# Patient Record
Sex: Female | Born: 1985 | Race: Black or African American | Hispanic: No | Marital: Single | Smoking: Never smoker
Health system: Southern US, Community
[De-identification: ages and names within clinical notes are randomized; demographics above are authoritative.]

## PROBLEM LIST (undated history)

## (undated) DIAGNOSIS — D649 Anemia, unspecified: Secondary | ICD-10-CM

## (undated) DIAGNOSIS — T7840XA Allergy, unspecified, initial encounter: Secondary | ICD-10-CM

## (undated) HISTORY — DX: Anemia, unspecified: D64.9

## (undated) HISTORY — DX: Allergy, unspecified, initial encounter: T78.40XA

---
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
## 2003-08-06 ENCOUNTER — Other Ambulatory Visit: Admission: RE | Admit: 2003-08-06 | Discharge: 2003-08-06 | Payer: Self-pay | Admitting: Internal Medicine

## 2004-09-11 ENCOUNTER — Other Ambulatory Visit: Admission: RE | Admit: 2004-09-11 | Discharge: 2004-09-11 | Payer: Self-pay | Admitting: Internal Medicine

## 2004-09-30 ENCOUNTER — Encounter: Admission: RE | Admit: 2004-09-30 | Discharge: 2004-09-30 | Payer: Self-pay | Admitting: Internal Medicine

## 2005-08-16 ENCOUNTER — Ambulatory Visit (HOSPITAL_COMMUNITY): Admission: RE | Admit: 2005-08-16 | Discharge: 2005-08-16 | Payer: Self-pay | Admitting: Obstetrics

## 2005-10-18 ENCOUNTER — Ambulatory Visit (HOSPITAL_COMMUNITY): Admission: RE | Admit: 2005-10-18 | Discharge: 2005-10-18 | Payer: Self-pay | Admitting: Obstetrics

## 2005-11-01 ENCOUNTER — Inpatient Hospital Stay (HOSPITAL_COMMUNITY): Admission: AD | Admit: 2005-11-01 | Discharge: 2005-11-01 | Payer: Self-pay | Admitting: Obstetrics & Gynecology

## 2005-11-02 ENCOUNTER — Inpatient Hospital Stay (HOSPITAL_COMMUNITY): Admission: AD | Admit: 2005-11-02 | Discharge: 2005-11-02 | Payer: Self-pay | Admitting: Obstetrics

## 2005-12-02 ENCOUNTER — Ambulatory Visit (HOSPITAL_COMMUNITY): Admission: RE | Admit: 2005-12-02 | Discharge: 2005-12-02 | Payer: Self-pay | Admitting: Obstetrics

## 2006-02-15 ENCOUNTER — Inpatient Hospital Stay (HOSPITAL_COMMUNITY): Admission: AD | Admit: 2006-02-15 | Discharge: 2006-02-15 | Payer: Self-pay | Admitting: Obstetrics

## 2006-02-21 ENCOUNTER — Inpatient Hospital Stay (HOSPITAL_COMMUNITY): Admission: AD | Admit: 2006-02-21 | Discharge: 2006-02-23 | Payer: Self-pay | Admitting: Obstetrics

## 2007-06-17 IMAGING — US US FETAL BPP W/O NONSTRESS
1 series · 14 of 26 positions shown · non-contrast
Comparison: 12/02/05.

CLINICAL DATA: Decreased fetal movement.

 BIOPHYSICAL PROFILE

[Series 1: us fetal bpp w/o nonstress · 0.35mm/px · 26 acquisitions, 14 frames shown]
[im 1/26]
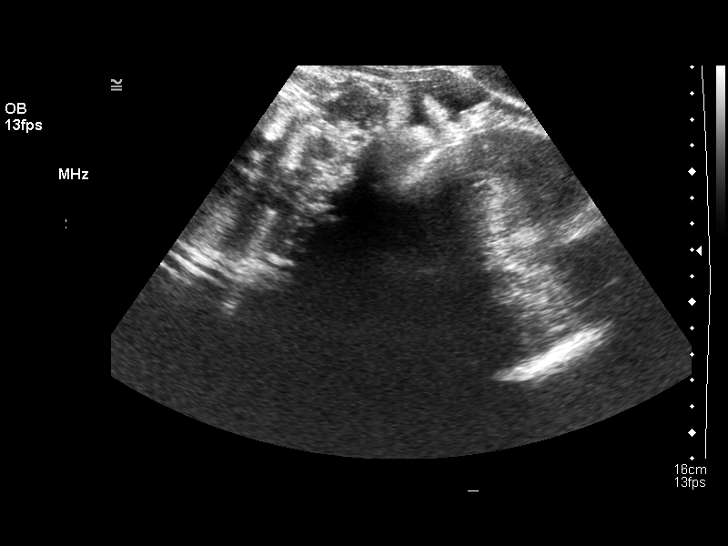
[im 3/26]
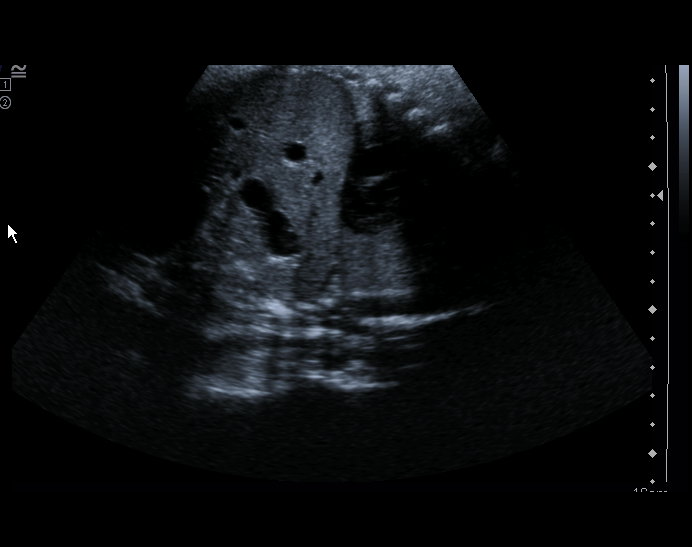
[im 5/26]
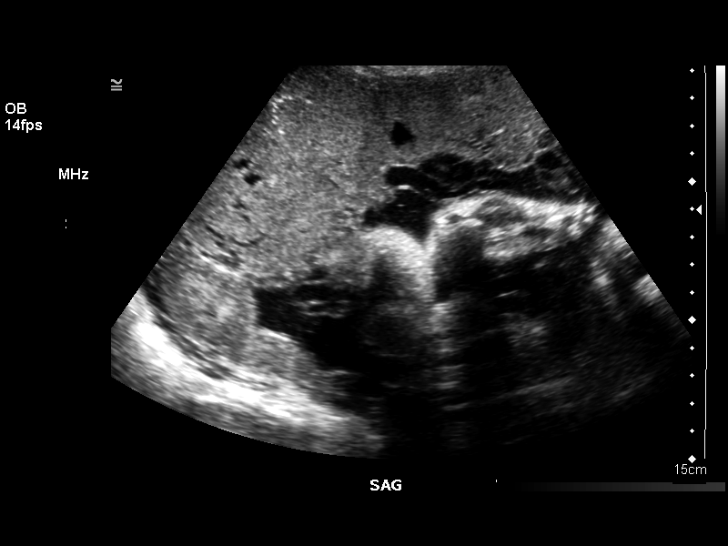
[im 7/26]
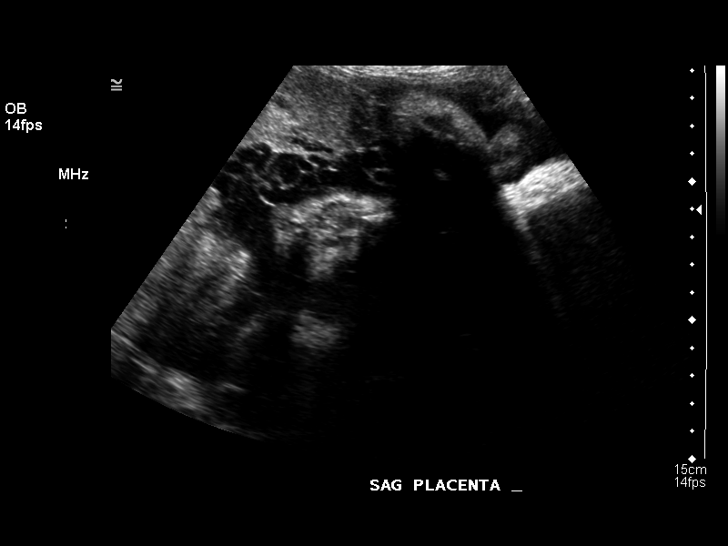
[im 9/26]
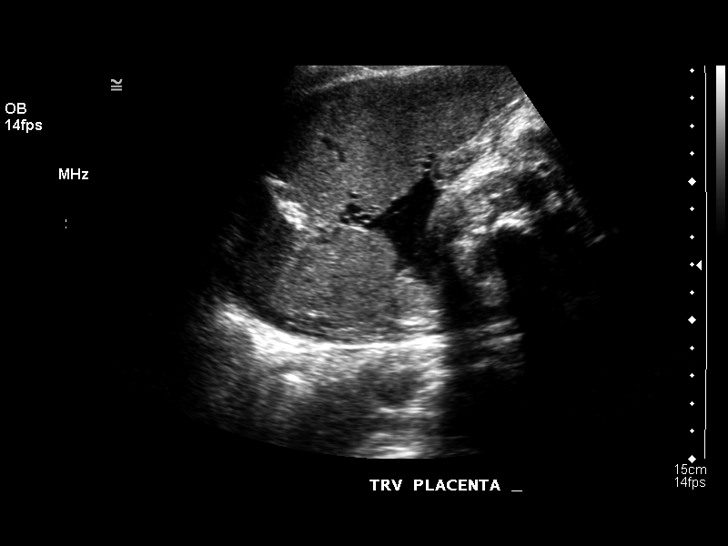
[im 11/26]
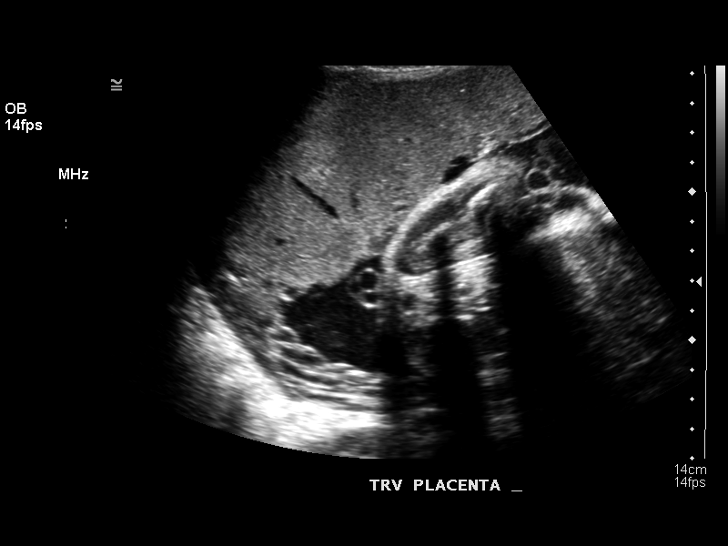
[im 13/26]
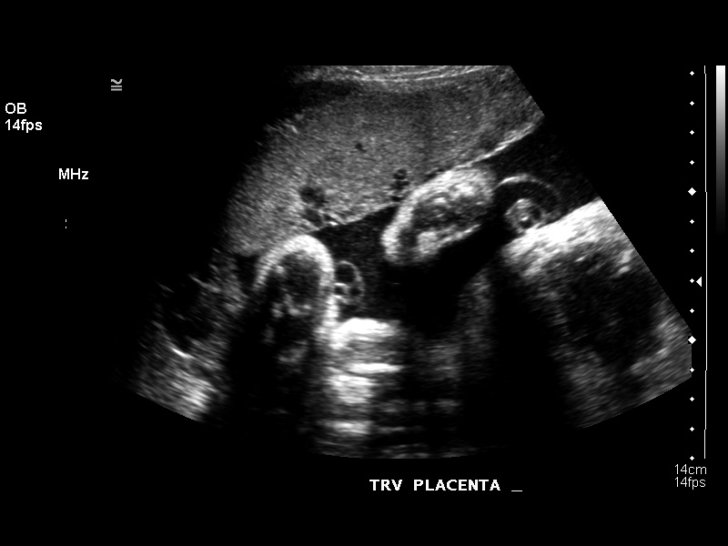
[im 14/26]
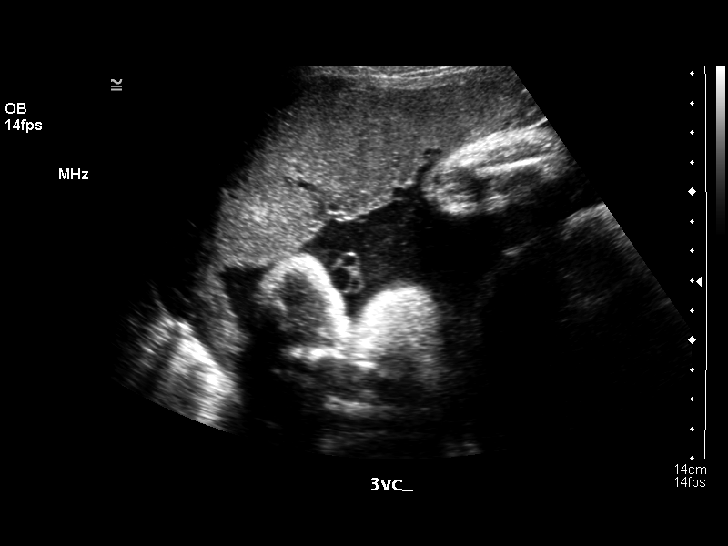
[im 16/26]
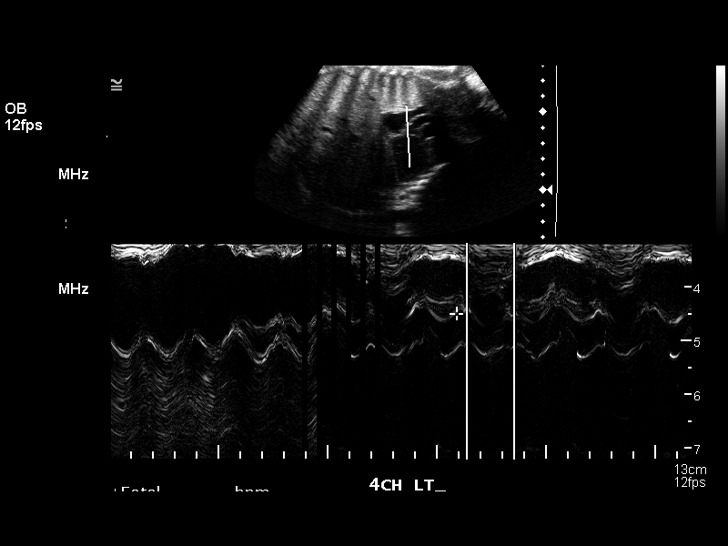
[im 18/26]
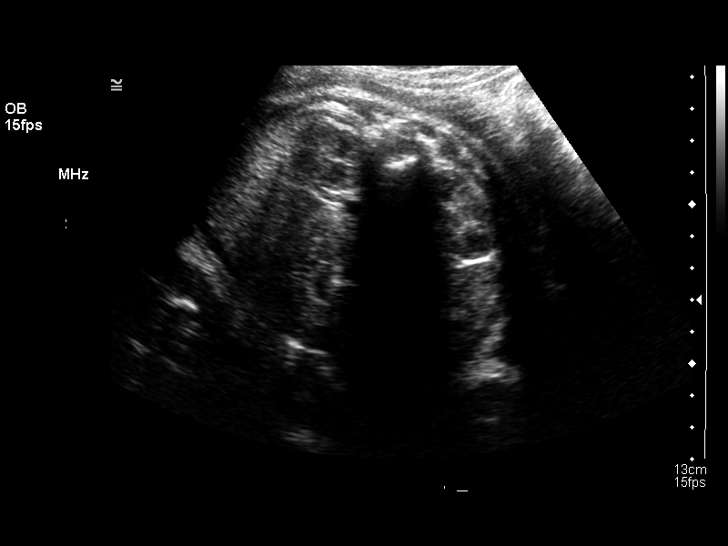
[im 20/26]
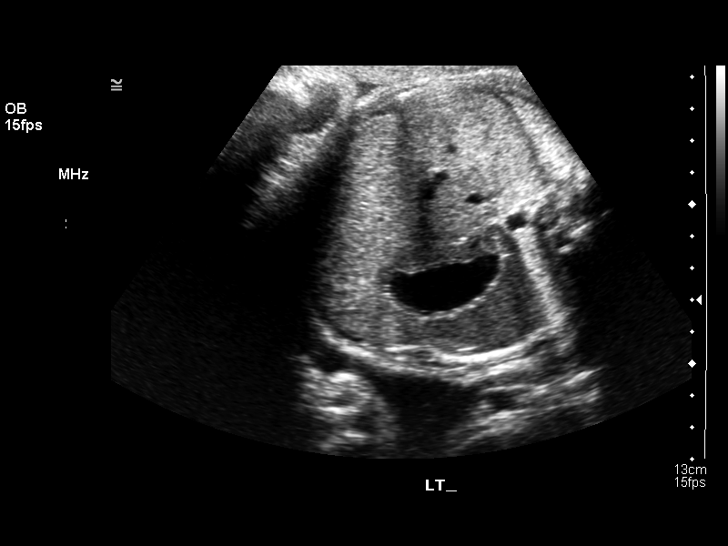
[im 22/26]
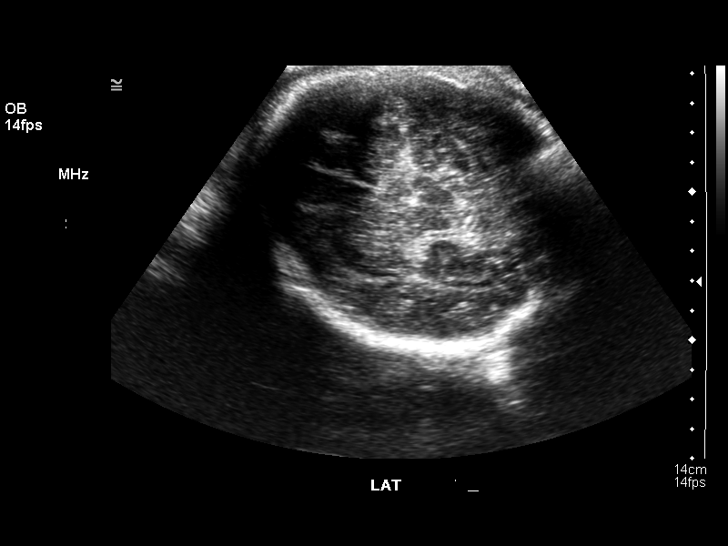
[im 24/26]
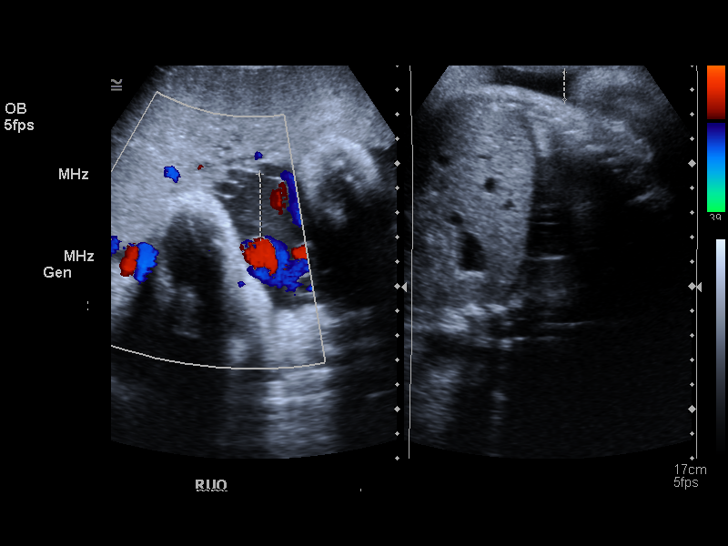
[im 26/26]
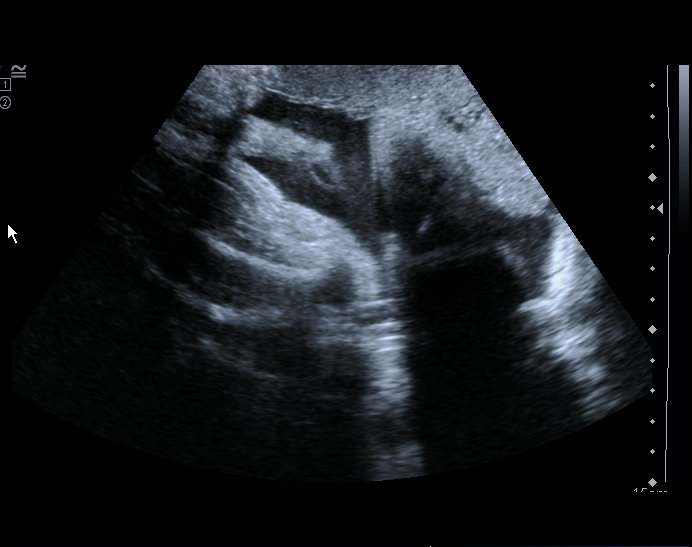

[14 of 26 positions shown; findings below may reference images not displayed]

Number of Fetuses: 1
 Heart rate: 139
 Movement:  Yes 
 Breathing:  Yes
 Presentation:  Cephalic
 Placental Location:  Right lateral 
 Grade:  I
 Previa:  No
 Amniotic Fluid (Subjective):  Normal
 Amniotic Fluid (Objective):  12.8 cm AFI (5th -95th%ile = 7.2 ? 22.6 cm for 39 wks)

 Fetal measurements and complete anatomic evaluation were not requested.  The following fetal anatomy was visualized on this exam:  Lateral ventricles, four chamber heart, stomach, 3-vessel cord, kidneys, and bladder.

 BPP SCORING
 Movements:  2  Time:  15 minutes
 Breathing:  2
 Tone:  2
 Amniotic Fluid:  2
 Total Score:  8

 MATERNAL UTERINE AND ADNEXAL FINDINGS
 Cervix:  Not evaluated > 34 wks
IMPRESSION: 1.  Single living intrauterine fetus in cephalic presentation with subjectively and quantitatively normal amniotic fluid volume.
 2.  Biophysical profile score [DATE] after 15 minutes of observation.

## 2007-12-21 ENCOUNTER — Encounter: Admission: RE | Admit: 2007-12-21 | Discharge: 2007-12-21 | Payer: Self-pay | Admitting: Surgery

## 2010-01-25 ENCOUNTER — Emergency Department (HOSPITAL_COMMUNITY): Admission: EM | Admit: 2010-01-25 | Discharge: 2010-01-25 | Payer: Self-pay | Admitting: Emergency Medicine

## 2010-07-28 ENCOUNTER — Emergency Department (HOSPITAL_COMMUNITY): Admission: EM | Admit: 2010-07-28 | Discharge: 2010-07-28 | Payer: Self-pay | Admitting: Emergency Medicine

## 2010-12-06 ENCOUNTER — Encounter: Payer: Self-pay | Admitting: Internal Medicine

## 2011-05-27 IMAGING — CT CT HEAD W/O CM
1 of 2 series · 13 of 30 positions shown, 17 images · non-contrast
Comparison: None.

CLINICAL DATA: 23-year-old with headache.

CT HEAD WITHOUT CONTRAST
TECHNIQUE: Contiguous axial images were obtained from the base of
the skull through the vertex without contrast.

[Series 2: brain · axial · 0.47mm/px · z∈[+130,+250]mm · 13 of 28 slices shown, 17 images]
[im 2/28  brain]
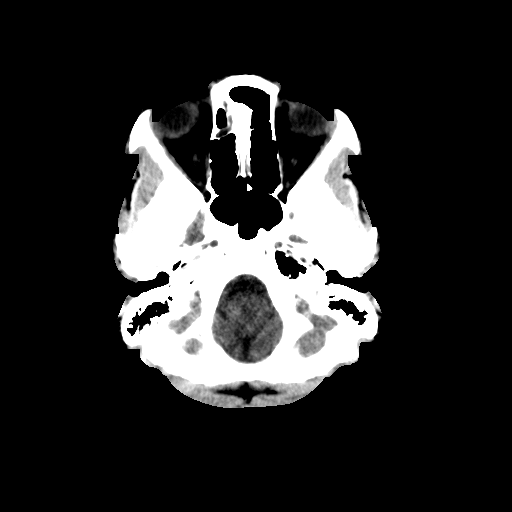
[im 2/28  bone]
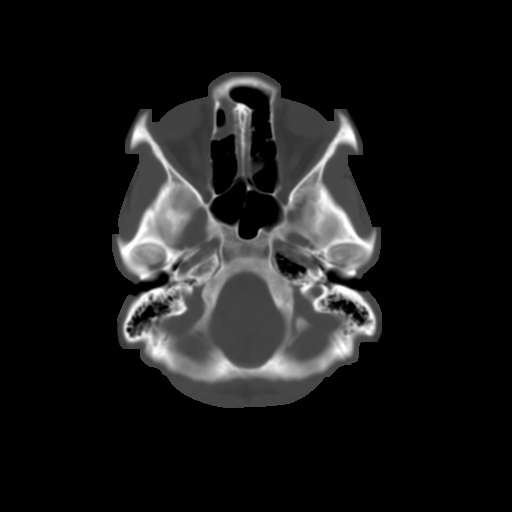
[im 4/28  brain]
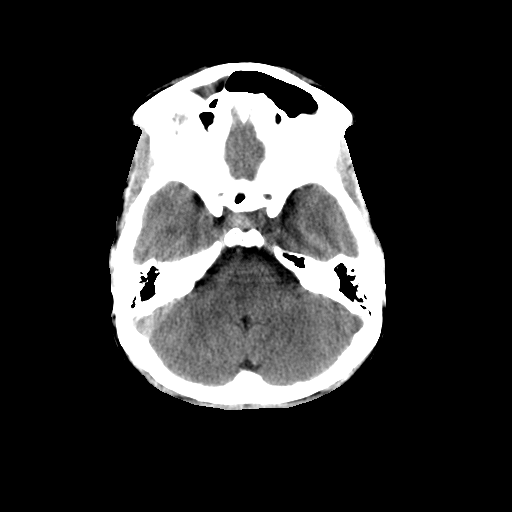
[im 6/28  brain]
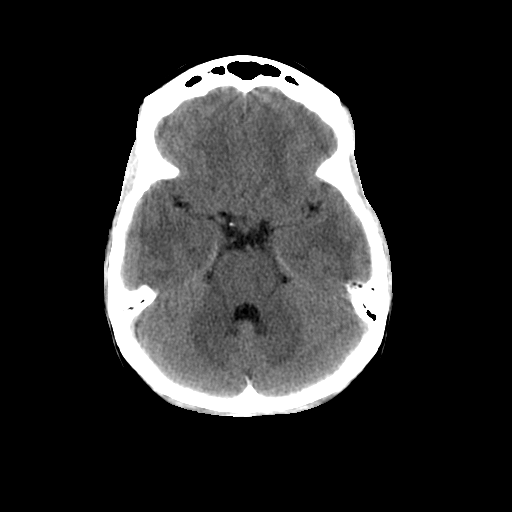
[im 8/28  brain]
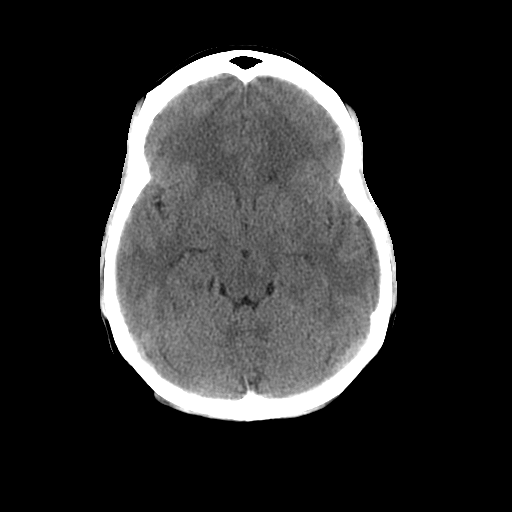
[im 10/28  brain]
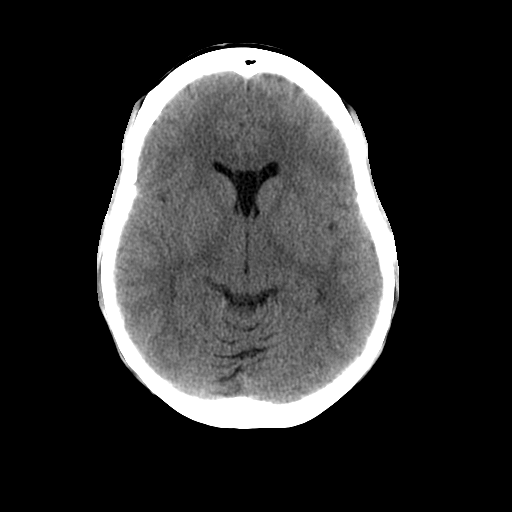
[im 10/28  bone]
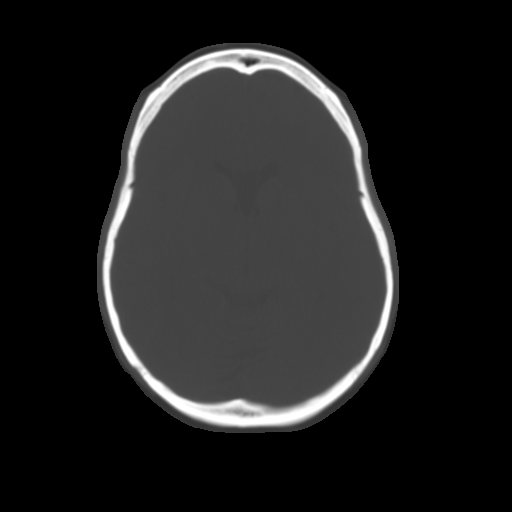
[im 12/28  brain]
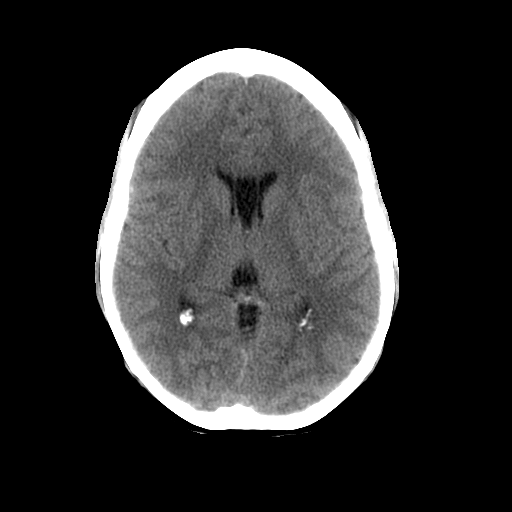
[im 14/28  brain]
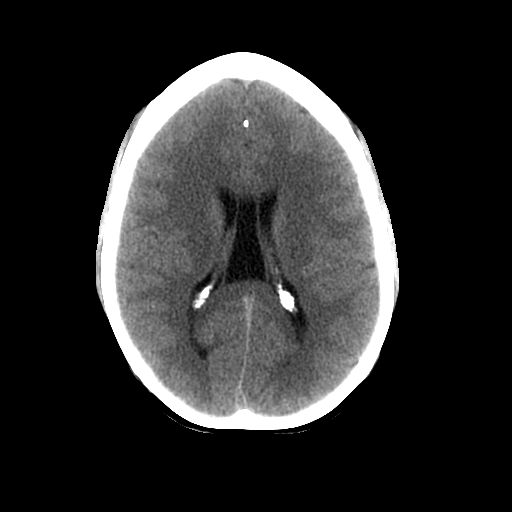
[im 16/28  brain]
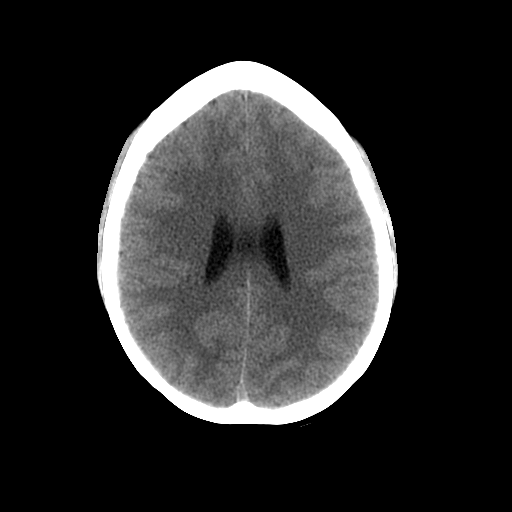
[im 18/28  brain]
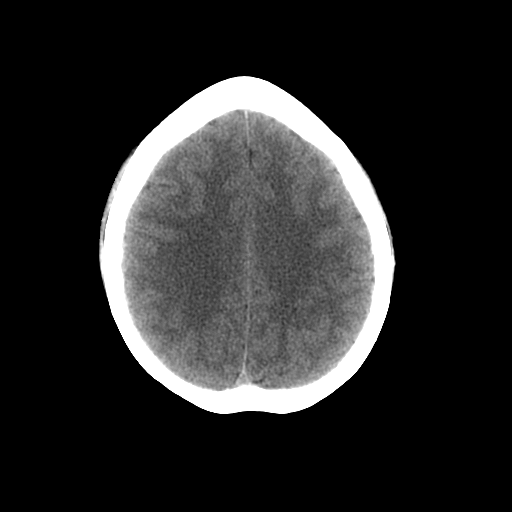
[im 18/28  bone]
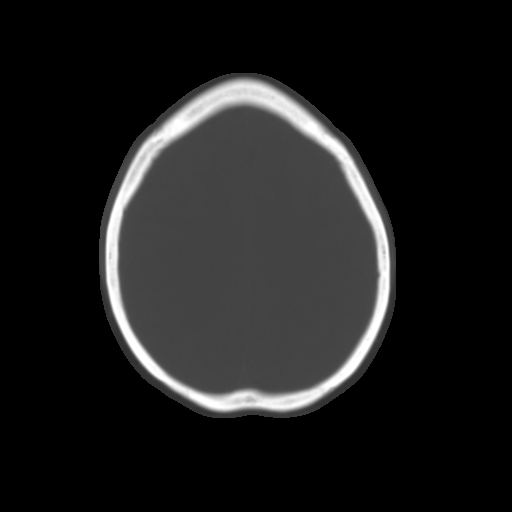
[im 20/28  brain]
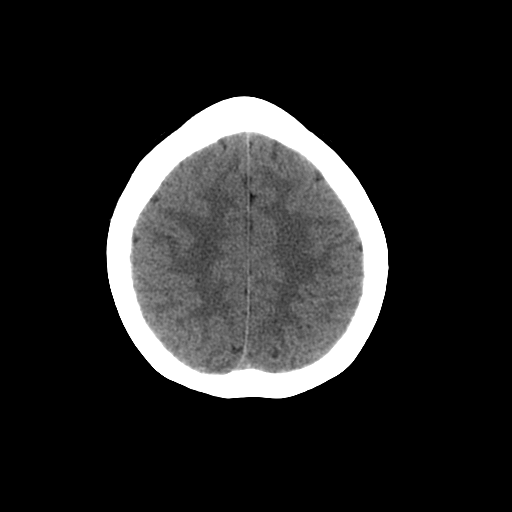
[im 22/28  brain]
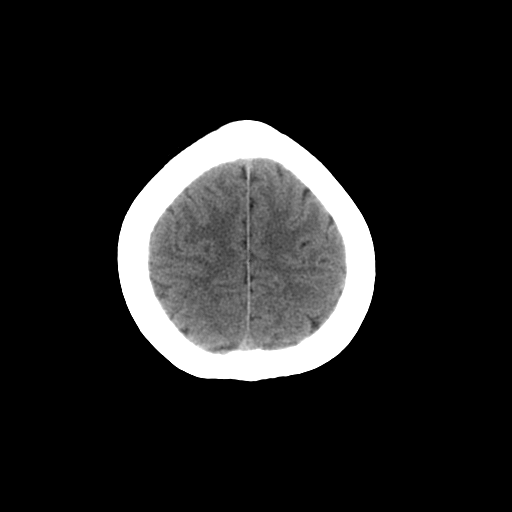
[im 24/28  brain]
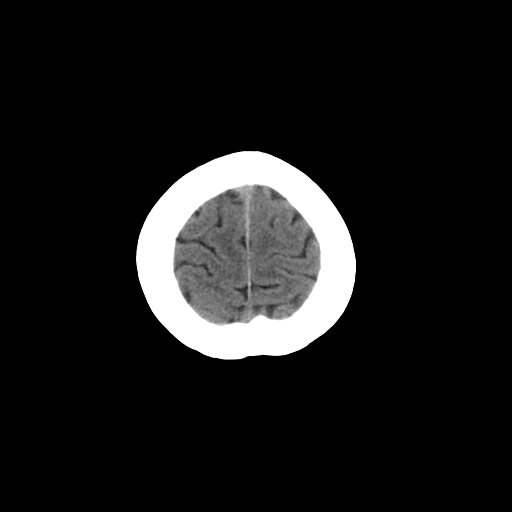
[im 26/28  brain]
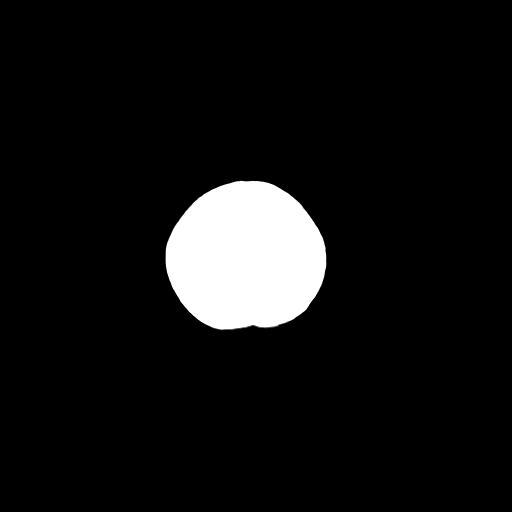
[im 26/28  bone]
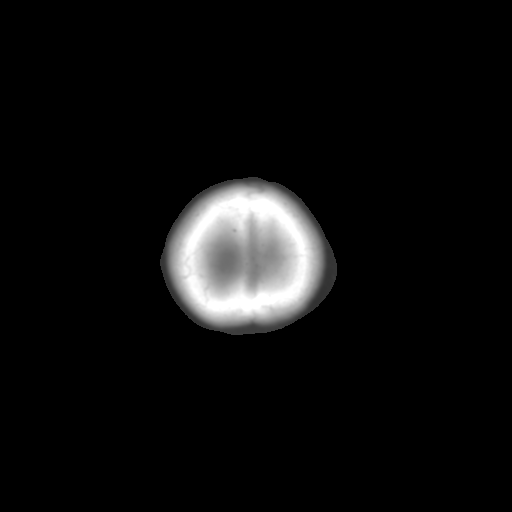

[13 of 30 positions shown; findings below may reference images not displayed]

FINDINGS: No evidence for acute hemorrhage, mass lesion, midline
shift,  hydrocephalus or large infarct.  Incidentally, the patient
has a cavum septum pellucidum and vergae. Patchy areas of mucosal
disease involving the ethmoid air cells with fluid and mucosal
disease in the right frontal sinus.  No acute bony abnormalities.
IMPRESSION: No acute intracranial abnormality.

Paranasal sinus disease.

## 2012-04-30 ENCOUNTER — Encounter (HOSPITAL_COMMUNITY): Payer: Self-pay | Admitting: Emergency Medicine

## 2012-04-30 ENCOUNTER — Emergency Department (HOSPITAL_COMMUNITY)
Admission: EM | Admit: 2012-04-30 | Discharge: 2012-04-30 | Disposition: A | Discharge status: Home / Self Care | Payer: Self-pay | Attending: Emergency Medicine | Admitting: Emergency Medicine

## 2012-04-30 DIAGNOSIS — N390 Urinary tract infection, site not specified: Secondary | ICD-10-CM | POA: Insufficient documentation

## 2012-04-30 LAB — URINALYSIS, ROUTINE W REFLEX MICROSCOPIC
Glucose, UA: NEGATIVE mg/dL
Protein, ur: 100 mg/dL — AB
Specific Gravity, Urine: 1.012 (ref 1.005–1.030)

## 2012-04-30 LAB — URINE MICROSCOPIC-ADD ON

## 2012-04-30 MED ORDER — NITROFURANTOIN MONOHYD MACRO 100 MG PO CAPS
100.0000 mg | ORAL_CAPSULE | Freq: Once | ORAL | Status: AC
  Administered 2012-04-30: 100 mg via ORAL
  Filled 2012-04-30: qty 1

## 2012-04-30 MED ORDER — NITROFURANTOIN MONOHYD MACRO 100 MG PO CAPS: 100.0000 mg | ORAL_CAPSULE | Freq: Two times a day (BID) | ORAL | Status: AC

## 2012-04-30 NOTE — ED Notes (Signed)
Patient currently sitting up in bed; no respiratory or acute distress noted.  Patient updated on plan of care; informed patient that we are currently waiting on disposition from EDP.  Patient has no other questions or concerns at this time; will continue to monitor. 

## 2012-04-30 NOTE — ED Provider Notes (Signed)
History     CSN: [id number]  Arrival date & time 04/30/12  1621   First MD Initiated Contact with Patient 04/30/12 1957      Chief Complaint  Patient presents with  . Pelvic Pain    (Consider location/radiation/quality/duration/timing/severity/associated sxs/prior treatment) HPI...Marland KitchenMarland KitchenMarland Kitchen dysuria, Dequincy, hematuria, per 24 hours. No fever, chills, flank pain. No vaginal bleeding or discharge. Slight suprapubic discomfort. Pain is minimal. Nursing notes reviewed.  History reviewed. No pertinent past medical history.  History reviewed. No pertinent past surgical history.  No family history on file.  History  Substance Use Topics  . Smoking status: Never Smoker   . Smokeless tobacco: Not on file  . Alcohol Use: Yes    OB History    Grav Para Term Preterm Abortions TAB SAB Ect Mult Living                  Review of Systems  All other systems reviewed and are negative.    Allergies  Review of patient's allergies indicates no known allergies.  Home Medications   Current Outpatient Rx  Name Route Sig Dispense Refill  . ULIPRISTAL ACETATE 30 MG PO TABS Oral Take 1 tablet by mouth once.    Marland Kitchen NITROFURANTOIN MONOHYD MACRO 100 MG PO CAPS Oral Take 1 capsule (100 mg total) by mouth 2 (two) times daily. X 7 days 14 capsule 0    BP 102/70  Pulse 78  Temp 98.4 F (36.9 C) (Oral)  Resp 18  SpO2 100%  LMP 04/17/2012  Physical Exam  Nursing note and vitals reviewed. Constitutional: She is oriented to person, place, and time. She appears well-developed and well-nourished.  HENT:  Head: Normocephalic and atraumatic.  Eyes: Conjunctivae and EOM are normal. Pupils are equal, round, and reactive to light.  Neck: Normal range of motion. Neck supple.  Cardiovascular: Normal rate and regular rhythm.   Pulmonary/Chest: Effort normal and breath sounds normal.  Abdominal: Soft. Bowel sounds are normal.       Slight suprapubic tenderness  Genitourinary:       No flank  tenderness  Musculoskeletal: Normal range of motion.  Neurological: She is alert and oriented to person, place, and time.  Skin: Skin is warm and dry.  Psychiatric: She has a normal mood and affect.    ED Course  Procedures (including critical care time)  Labs Reviewed  URINALYSIS, ROUTINE W REFLEX MICROSCOPIC - Abnormal; Notable for the following:    APPearance CLOUDY (*)     Hgb urine dipstick LARGE (*)     Protein, ur 100 (*)     Nitrite POSITIVE (*)     Leukocytes, UA LARGE (*)     All other components within normal limits  URINE MICROSCOPIC-ADD ON - Abnormal; Notable for the following:    Squamous Epithelial / LPF FEW (*)     Bacteria, UA FEW (*)     All other components within normal limits  POCT PREGNANCY, URINE  URINE CULTURE   No results found.   1. Urinary tract infection       MDM  No acute abdomen. No vaginal bleeding or discharge. urinalysis shows evidence of infection. no clinical evidence of Pyelo.  Start Macrobid.        Donnetta Hutching, MD 04/30/12 2212

## 2012-04-30 NOTE — Discharge Instructions (Signed)
Urinary Tract Infection A urinary tract infection (UTI) is often caused by a germ (bacteria). A UTI is usually helped with medicine (antibiotics) that kills germs. Take all the medicine until it is gone. Do this even if you are feeling better. You are usually better in 7 to 10 days. HOME CARE   Drink enough water and fluids to keep your pee (urine) clear or pale yellow. Drink:   Cranberry juice.   Water.   Avoid:   Caffeine.   Tea.   Bubbly (carbonated) drinks.   Alcohol.   Only take medicine as told by your doctor.   To prevent further infections:   Pee often.   After pooping (bowel movement), women should wipe from front to back. Use each tissue only once.   Pee before and after having sex (intercourse).  Ask your doctor when your test results will be ready. Make sure you follow up and get your test results.  GET HELP RIGHT AWAY IF:   There is very bad back pain or lower belly (abdominal) pain.   You get the chills.   You have a fever.   Your baby is older than 3 months with a rectal temperature of 102 F (38.9 C) or higher.   Your baby is 5 months old or younger with a rectal temperature of 100.4 F (38 C) or higher.   You feel sick to your stomach (nauseous) or throw up (vomit).   There is continued burning with peeing.   Your problems are not better in 3 days. Return sooner if you are getting worse.  MAKE SURE YOU:   Understand these instructions.   Will watch your condition.   Will get help right away if you are not doing well or get worse.  Document Released: 04/19/2008 Document Revised: 10/21/2011 Document Reviewed: 04/19/2008 [facility] Patient Information 2012 [place], [place]. You have a urinary tract infection.  Increase fluids. Antibiotic for one week.

## 2012-04-30 NOTE — ED Notes (Signed)
C/o pelvic pain since having "rough sex" on Friday.  Pt states she took the "morning after pill" on Saturday.  C/o hematuria and increased pelvic discomfort today.

## 2012-04-30 NOTE — ED Notes (Signed)
Patient complaining of pain around pelvic area that started on Friday after patient engaged in sexual activity; patient states that pelvic pain has been constant since Friday, but has increased in intensity over the course of the day.  Patient also reports hematuria and pain during urination.  Describes pain as "sorness"; rates pain 6/10 on the numerical pain scale.  Patient alert and oriented x4; PERRL present.  Will continue to monitor.

## 2012-04-30 NOTE — ED Notes (Signed)
Called pharmacy to send down Macrobid STAT. 

## 2012-04-30 NOTE — ED Notes (Signed)
On friday, had sex and it was rougher than usually, the next day, everything was normal , on sat went and get emergency contraceptive and took the med.  Sunday started having painful urination and some blood in the urine.

## 2012-04-30 NOTE — ED Notes (Signed)
Patient currently resting quietly in bed; no respiratory or acute distress noted.  Patient updated on plan of care; informed patient that we are currently waiting on further orders from EDP.  Patient has no other questions or concerns at this time; will continue to monitor. 

## 2012-04-30 NOTE — ED Notes (Signed)
Patient given discharge instructions; went over discharge paperwork with patient.  Instructed patient to take antibiotic as directed (and to finish entire prescription completely).  Instructed to follow up with primary care physician if symptoms do not resolve and to return to the ED for new, worsening, or concerning symptoms.

## 2012-04-30 NOTE — ED Notes (Signed)
Patient currently resting quietly in bed; no respiratory or acute distress noted.  Patient updated on plan of care; informed patient that we are currently waiting on discharge paperwork from EDP. Patient has no other questions or concerns at this time; will continue to monitor. 

## 2012-05-03 LAB — URINE CULTURE: Colony Count: >=100000

## 2012-05-04 NOTE — ED Notes (Signed)
+   Urine] Patient treated with Macrobid-sensitive to same-chart appended per protocol MD. 

## 2013-09-26 ENCOUNTER — Ambulatory Visit (INDEPENDENT_AMBULATORY_CARE_PROVIDER_SITE_OTHER): Payer: 59 | Admitting: Family Medicine

## 2013-09-26 VITALS — BP 107/75 | HR 84 | Temp 98.4°F | Resp 18 | Ht 62.0 in | Wt 101.2 lb

## 2013-09-26 DIAGNOSIS — R42 Dizziness and giddiness: Secondary | ICD-10-CM

## 2013-09-26 DIAGNOSIS — R55 Syncope and collapse: Secondary | ICD-10-CM

## 2013-09-26 DIAGNOSIS — Z113 Encounter for screening for infections with a predominantly sexual mode of transmission: Secondary | ICD-10-CM

## 2013-09-26 DIAGNOSIS — D649 Anemia, unspecified: Secondary | ICD-10-CM

## 2013-09-26 LAB — POCT UA - MICROSCOPIC ONLY
Casts, Ur, LPF, POC: NEGATIVE
Crystals, Ur, HPF, POC: NEGATIVE

## 2013-09-26 LAB — POCT URINALYSIS DIPSTICK
Blood, UA: NEGATIVE
Protein, UA: NEGATIVE
Spec Grav, UA: 1.015
Urobilinogen, UA: 0.2
pH, UA: 6

## 2013-09-26 LAB — POCT CBC
Granulocyte percent: 40.1 %G (ref 37–80)
HCT, POC: 34.4 % — AB (ref 37.7–47.9)
Hemoglobin: 10.2 g/dL — AB (ref 12.2–16.2)
MCV: 88.2 fL (ref 80–97)
MID (cbc): 0.5 (ref 0–0.9)
POC LYMPH PERCENT: 51.5 %L — AB (ref 10–50)
POC MID %: 8.4 %M (ref 0–12)
RDW, POC: 13.7 %

## 2013-09-26 NOTE — Progress Notes (Addendum)
Subjective:    Patient ID: Claudia Jackson, female    DOB: Sep 02, 1986, 27 y.o.   MRN: [id number]  This chart was scribed for Ethelda Chick, MD by Greggory Stallion, Medical Scribe. This patient's care was started at 7:18 PM.  HPI HPI Comments: Claudia Jackson is a 27 y.o. female who presents to the office complaining of sudden onset dizziness with associated foggy vision and nausea that started yesterday around 9:30 AM. Pt states she started seeing black spots and getting SOB. Denies LOC. She states she went home, rested and started feeling better. Pt did not eat or drink anything yesterday morning before the symptoms started. She states she doesn't normally eat breakfast but she drinks juice; yesterday she did not drink juice or eat in morning. Pt woke up fine this morning but states that later into her work day around 11:30am, she started getting the same symptoms. She describes it as "shaky on the inside" but never noticed any shakes. Pt states she felt better after eating lunch but her symptoms returned later in the afternoon around 4 PM. She denies headache, tinnitus, emesis, abdominal pain, urinary frequency, dysuria, fever, vaginal discharge, vaginal bleeding, chest pain, palpitations, cough, abnormal weight change. Pt has been under a lot of stress lately but no more than usual. She has not started or stopped any medications recently. LMP ended 2 days ago. Pt has taken 2 pregnancy tests in the last two days and they were both negative. Denies any chronic health issues and hospitalizations. Pt denies alcohol or drug use. Her mother has history of HTN and her brother has history of lymphoma.   2. STD screening: requesting STD screening; has not undergone screening in the past year.  Denies vaginal discharge, odor, irritation/itching.  Asymptomatic at this time.  PCP: none  There are no active problems to display for this patient.  History reviewed. No pertinent past medical  history. History reviewed. No pertinent past surgical history. No Known Allergies Prior to Admission medications   Medication Sig Start Date End Date Taking? Authorizing Provider  Ulipristal Acetate (ELLA) 30 MG tablet Take 1 tablet by mouth once.    Historical Provider, MD   History   Social History  . Marital Status: Single    Spouse Name: N/A    Number of Children: N/A  . Years of Education: N/A   Occupational History  . Not on file.   Social History Main Topics  . Smoking status: Never Smoker   . Smokeless tobacco: Not on file  . Alcohol Use: Yes  . Drug Use: No  . Sexual Activity:    Other Topics Concern  . Not on file   Social History Narrative  . No narrative on file     Review of Systems  Constitutional: Positive for fatigue. Negative for fever, chills, diaphoresis, activity change, appetite change and unexpected weight change.  HENT: Negative for congestion, ear pain, postnasal drip, rhinorrhea, sore throat, tinnitus, trouble swallowing and voice change.   Eyes: Positive for visual disturbance.  Respiratory: Positive for shortness of breath. Negative for cough.   Cardiovascular: Negative for chest pain and palpitations.  Gastrointestinal: Positive for nausea. Negative for vomiting, abdominal pain, diarrhea and abdominal distention.  Genitourinary: Negative for dysuria, urgency, frequency, vaginal bleeding, vaginal discharge and pelvic pain.  Skin: Negative for rash.  Neurological: Positive for dizziness and light-headedness. Negative for tremors, seizures, syncope, facial asymmetry, speech difficulty, weakness, numbness and headaches.  Psychiatric/Behavioral: Negative for confusion and dysphoric  mood. The patient is not nervous/anxious.        Objective:   Physical Exam  Constitutional: She is oriented to person, place, and time. She appears well-developed and well-nourished. No distress.  HENT:  Head: Normocephalic and atraumatic.  Right Ear: Tympanic  membrane, external ear and ear canal normal.  Left Ear: Tympanic membrane, external ear and ear canal normal.  Nose: Nose normal.  Mouth/Throat: Uvula is midline, oropharynx is clear and moist and mucous membranes are normal.  Eyes: Conjunctivae and EOM are normal. Pupils are equal, round, and reactive to light.  Neck: Neck supple. No tracheal deviation present.  Cardiovascular: Normal rate, regular rhythm, normal heart sounds and intact distal pulses.   No murmur heard. Pulmonary/Chest: Effort normal and breath sounds normal. No respiratory distress. She has no wheezes. She has no rales.  Abdominal: Soft. There is no tenderness. There is no rebound and no guarding.  Musculoskeletal: Normal range of motion.  Lymphadenopathy:    She has no cervical adenopathy.  Neurological: She is alert and oriented to person, place, and time. She has normal strength. No cranial nerve deficit. She exhibits normal muscle tone. She displays a negative Romberg sign. Coordination normal.  Neurovascularly intact.   Skin: Skin is warm and dry. No rash noted. She is not diaphoretic.  Psychiatric: She has a normal mood and affect. Her behavior is normal. Judgment and thought content normal.    Filed Vitals:   09/26/13 1759  BP: 100/60  Pulse: 78  Temp: 98.4 F (36.9 C)  TempSrc: Oral  Resp: 18  Height: 5\' 2"  (1.575 m)  Weight: 101 lb 3.2 oz (45.904 kg)  SpO2: 99%   Results for orders placed in visit on 09/26/13  POCT CBC      Result Value Range   WBC 6.4  4.6 - 10.2 K/uL   Lymph, poc 3.3  0.6 - 3.4   POC LYMPH PERCENT 51.5 (*) 10 - 50 %L   MID (cbc) 0.5  0 - 0.9   POC MID % 8.4  0 - 12 %M   POC Granulocyte 2.6  2 - 6.9   Granulocyte percent 40.1  37 - 80 %G   RBC 3.90 (*) 4.04 - 5.48 M/uL   Hemoglobin 10.2 (*) 12.2 - 16.2 g/dL   HCT, POC 16.1 (*) 09.6 - 47.9 %   MCV 88.2  80 - 97 fL   MCH, POC 26.2 (*) 27 - 31.2 pg   MCHC 29.7 (*) 31.8 - 35.4 g/dL   RDW, POC 04.5     Platelet Count, POC 271   142 - 424 K/uL   MPV 7.6  0 - 99.8 fL  POCT UA - MICROSCOPIC ONLY      Result Value Range   WBC, Ur, HPF, POC 0-2     RBC, urine, microscopic 0-1     Bacteria, U Microscopic trace     Mucus, UA neg     Epithelial cells, urine per micros 0-2     Crystals, Ur, HPF, POC neg     Casts, Ur, LPF, POC neg     Yeast, UA neg    GLUCOSE, POCT (MANUAL RESULT ENTRY)      Result Value Range   POC Glucose 75  70 - 99 mg/dl  POCT URINE PREGNANCY      Result Value Range   Preg Test, Ur Negative    POCT URINALYSIS DIPSTICK      Result Value Range   Color, UA  yellow     Clarity, UA clear     Glucose, UA neg     Bilirubin, UA neg     Ketones, UA neg     Spec Grav, UA 1.015     Blood, UA neg     pH, UA 6.0     Protein, UA neg     Urobilinogen, UA 0.2     Nitrite, UA neg     Leukocytes, UA Negative         Assessment & Plan:  Dizziness - Plan: POCT CBC, POCT UA - Microscopic Only, POCT glucose (manual entry), POCT urine pregnancy, POCT urinalysis dipstick, TSH, Comprehensive metabolic panel, CANCELED: TSH, CANCELED: Comprehensive metabolic panel  Near syncope - Plan: POCT CBC, POCT UA - Microscopic Only, POCT glucose (manual entry), POCT urine pregnancy, POCT urinalysis dipstick, TSH, Comprehensive metabolic panel, CANCELED: TSH, CANCELED: Comprehensive metabolic panel  Screen for STD (sexually transmitted disease) - Plan: GC/Chlamydia Probe Amp  1. Dizziness:  New.  Normal neurological exam in office.  Obtain labs including CMET, TSH. Encourage regular hydration and regular po intake.  Ddx includes dehydration, hypoglycemia, acute illness.  If worsens, advised to RTC. 2. Near syncope: New.  Obtain labs.  Initial work up negative in office; encourage increased fluid intake and regular po intake.   3. STD screening:  New. Obtain uribprobe, RPR, HIV.  Counseling provided.    I personally performed the services described in this documentation, which was scribed in my presence.  The recorded  information has been reviewed and is accurate.

## 2013-09-28 LAB — COMPREHENSIVE METABOLIC PANEL
ALT: 31 IU/L (ref 0–32)
Albumin/Globulin Ratio: 1.5 (ref 1.1–2.5)
Albumin: 4.6 g/dL (ref 3.5–5.5)
Alkaline Phosphatase: 78 IU/L (ref 39–117)
BUN/Creatinine Ratio: 11 (ref 8–20)
BUN: 9 mg/dL (ref 6–20)
Chloride: 101 mmol/L (ref 97–108)
GFR calc Af Amer: 110 mL/min/{1.73_m2} (ref >59)

## 2013-09-28 LAB — HIV ANTIBODY (ROUTINE TESTING W REFLEX)
HIV 1/O/2 Abs-Index Value: <1 (ref <1.00)
HIV-1/HIV-2 Ab: NONREACTIVE

## 2013-09-28 LAB — IRON AND TIBC
TIBC: 388 ug/dL (ref 250–450)
UIBC: 365 ug/dL (ref 150–375)

## 2013-09-28 LAB — SPECIMEN STATUS REPORT

## 2013-09-30 ENCOUNTER — Encounter: Payer: Self-pay | Admitting: Family Medicine

## 2013-10-02 ENCOUNTER — Encounter: Payer: Self-pay | Admitting: Family Medicine

## 2014-03-17 ENCOUNTER — Ambulatory Visit (INDEPENDENT_AMBULATORY_CARE_PROVIDER_SITE_OTHER): Payer: 59 | Admitting: Family Medicine

## 2014-03-17 VITALS — BP 112/60 | HR 78 | Temp 98.4°F | Resp 14 | Ht 61.75 in | Wt 108.0 lb

## 2014-03-17 DIAGNOSIS — Z23 Encounter for immunization: Secondary | ICD-10-CM

## 2014-03-17 DIAGNOSIS — IMO0002 Reserved for concepts with insufficient information to code with codable children: Secondary | ICD-10-CM

## 2014-03-17 DIAGNOSIS — Z309 Encounter for contraceptive management, unspecified: Secondary | ICD-10-CM

## 2014-03-17 DIAGNOSIS — Z01419 Encounter for gynecological examination (general) (routine) without abnormal findings: Secondary | ICD-10-CM

## 2014-03-17 DIAGNOSIS — D649 Anemia, unspecified: Secondary | ICD-10-CM

## 2014-03-17 DIAGNOSIS — N926 Irregular menstruation, unspecified: Secondary | ICD-10-CM

## 2014-03-17 LAB — POCT CBC
Granulocyte percent: 48.6 %G (ref 37–80)
HEMATOCRIT: 35.1 % — AB (ref 37.7–47.9)
HEMOGLOBIN: 11 g/dL — AB (ref 12.2–16.2)
LYMPH, POC: 2.9 (ref 0.6–3.4)
MCH, POC: 26.4 pg — AB (ref 27–31.2)
MCHC: 31.3 g/dL — AB (ref 31.8–35.4)
MCV: 84.4 fL (ref 80–97)
MID (CBC): 0.6 (ref 0–0.9)
MPV: 7.4 fL (ref 0–99.8)
POC GRANULOCYTE: 3.3 (ref 2–6.9)
POC LYMPH PERCENT: 42.6 %L (ref 10–50)
POC MID %: 8.8 % (ref 0–12)
Platelet Count, POC: 628 10*3/uL — AB (ref 142–424)
RBC: 4.16 M/uL (ref 4.04–5.48)
RDW, POC: 15.2 %
WBC: 6.8 10*3/uL (ref 4.6–10.2)

## 2014-03-17 LAB — POCT URINALYSIS DIPSTICK
Bilirubin, UA: NEGATIVE
GLUCOSE UA: NEGATIVE
KETONES UA: NEGATIVE
Leukocytes, UA: NEGATIVE
Nitrite, UA: NEGATIVE
Protein, UA: NEGATIVE
SPEC GRAV UA: 1.025
Urobilinogen, UA: 0.2
pH, UA: 5.5

## 2014-03-17 LAB — POCT URINE PREGNANCY: PREG TEST UR: NEGATIVE

## 2014-03-17 MED ORDER — MEDROXYPROGESTERONE ACETATE 10 MG PO TABS: 10.0000 mg | ORAL_TABLET | Freq: Every day | ORAL | Status: DC

## 2014-03-17 MED ORDER — ETONOGESTREL-ETHINYL ESTRADIOL 0.12-0.015 MG/24HR VA RING: VAGINAL_RING | VAGINAL | Status: DC

## 2014-03-17 NOTE — Progress Notes (Addendum)
Subjective:  This chart was scribed for Claudia SimmerKristi Smith, MD by Claudia Jackson, Medical Scribe. This patient was seen in Room 3 and the patient's care was started at 9:12 AM.   Patient ID: Claudia Jackson, female    DOB: 05/20/1986, 28 y.o.   MRN: [id number]  HPI HPI Comments: Claudia Jackson is a 28 y.o. female who presents to the [facility] complaining of anemic symptoms.  The patient states that she has not been taking her iron supplements.  She states that she has changed her diet by implementing more meat, beans, leafy greens, and peanut butter crackers as a snack.  She states that she has not experienced any more dizzy spells since she changed her diet.  She has been eating more frequently at work.  No further dizziness episodes since visit in 09/2013.  The patient is also here for a pap smear.  The patient states that her last pap smear was 2 years ago.  The patient states that she has a history of an abnormal pap smear but denies remembering when that was.  She states that she had a colposcopy and did not have abnormal results.  The patient denies having received the Gardsasil series.  The patient states that she needs a TDAP.  The patient states that she wears glasses now.  She states that she has dental surgery this month.    She states that her LNMP was March 26th and ended on April 1.  The patient states that she is not sexually active and has not had her period this month; last sexual activity end of November 2014.  Uses condoms 90% of time.  She denies having a history of asthma.  The patient states that she has a history of allergies and she takes Claritin to treat her symptoms.  The patient's mother is 30 years old and does not have any health problems.  She states that her father is living and does not have any health problems.  She states that her brother has a history of lymphoma but has been in remission for 8 years.  She states that her maternal grandmother  was diagnosed with breast cancer but states that she was diagnosed with it before she was born.  She states that she is dating and she has a son that is 8 years ago.  She states that she and her son live with her parents.  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  The patient denies having a history of tobacco or drug use.  She states that she drinks socially with friends.  The patient denies having a history of DWIs and states that she uses her seatbelt when she drives.  The patient states that she has had sex with 4 people in her life and uses condoms 85-95% of the time.  She denies having a history of STDs.  She states that she abstains from having sex as birth control.  She states that she has used NuvaRing in the past and did not experience any complications while she was on the medication.    She denies chest pain, diarrhea, constipation, and heartburn as associated symptoms.  She states that she has normal bowel movements.  The patient states that she will wake up once every night to use the bathroom.  The patient states that she takes Tylenol PM to treat her dental pain.    Mother with pituitary adenoma  age 27.  Detected with new onset amenorrhea with mother.    No past medical history on file. No past surgical history on file. No family history on file. History   Social History  . Marital Status: Single    Spouse Name: N/A    Number of Children: N/A  . Years of Education: N/A   Occupational History  . Not on file.   Social History Main Topics  . Smoking status: Never Smoker   . Smokeless tobacco: Not on file  . Alcohol Use: Yes  . Drug Use: No  . Sexual Activity:    Other Topics Concern  . Not on file   Social History Narrative  . No narrative on file   No Known Allergies  Family History  Problem Relation Age of Onset  . Cancer Brother     Hodgkin;s Lymphoma    Review of Systems  Constitutional: Negative for fever,  chills, diaphoresis, activity change, appetite change, fatigue and unexpected weight change.  HENT: Positive for dental problem. Negative for rhinorrhea, sore throat and voice change.   Respiratory: Negative for shortness of breath.   Cardiovascular: Negative for chest pain and leg swelling.  Gastrointestinal: Negative for nausea, vomiting, abdominal pain, diarrhea and constipation.  Endocrine: Negative for cold intolerance, heat intolerance, polydipsia, polyphagia and polyuria.  Genitourinary: Positive for menstrual problem. Negative for dysuria, frequency, hematuria, vaginal bleeding, vaginal discharge and pelvic pain.  Skin: Negative for rash.  Neurological: Negative for dizziness, syncope, light-headedness and headaches.  Psychiatric/Behavioral: Negative for sleep disturbance, self-injury and dysphoric mood. The patient is not nervous/anxious.   All other systems reviewed and are negative.    Objective:  Physical Exam  Nursing note and vitals reviewed. Constitutional: She is oriented to person, place, and time. She appears well-developed and well-nourished. No distress.  HENT:  Head: Normocephalic and atraumatic.  Right Ear: External ear normal.  Left Ear: External ear normal.  Mouth/Throat: Oropharynx is clear and moist. No oropharyngeal exudate.  Eyes: Conjunctivae and EOM are normal. Pupils are equal, round, and reactive to light.  Neck: Normal range of motion. Neck supple. No thyromegaly present.  Cardiovascular: Normal rate, regular rhythm and normal heart sounds.  Exam reveals no gallop and no friction rub.   No murmur heard. Pulmonary/Chest: Effort normal and breath sounds normal. No respiratory distress. She has no wheezes. She has no rales. Right breast exhibits no inverted nipple, no mass, no nipple discharge, no skin change and no tenderness. Left breast exhibits no inverted nipple, no mass, no nipple discharge, no skin change and no tenderness. Breasts are symmetrical.    Abdominal: Soft. Bowel sounds are normal. She exhibits no distension and no mass. There is no tenderness. There is no rebound and no guarding.  Genitourinary: Vagina normal and uterus normal. No vaginal discharge found.  Musculoskeletal: Normal range of motion.  Lymphadenopathy:    She has no cervical adenopathy.  Neurological: She is alert and oriented to person, place, and time.  Skin: Skin is warm and dry. No rash noted. She is not diaphoretic.  Psychiatric: She has a normal mood and affect. Her behavior is normal.    TDAP ADMINISTERED IN OFFICE.  Results for orders placed in visit on 03/17/14  POCT URINALYSIS DIPSTICK      Result Value Ref Range   Color, UA YELLOW     Clarity, UA CLEAR     Glucose, UA NEG     Bilirubin, UA NEG     Ketones, UA NEG  Spec Grav, UA 1.025     Blood, UA TRACE     pH, UA 5.5     Protein, UA NEG     Urobilinogen, UA 0.2     Nitrite, UA NEG     Leukocytes, UA Negative    POCT CBC      Result Value Ref Range   WBC 6.8  4.6 - 10.2 K/uL   Lymph, poc 2.9  0.6 - 3.4   POC LYMPH PERCENT 42.6  10 - 50 %L   MID (cbc) 0.6  0 - 0.9   POC MID % 8.8  0 - 12 %M   POC Granulocyte 3.3  2 - 6.9   Granulocyte percent 48.6  37 - 80 %G   RBC 4.16  4.04 - 5.48 M/uL   Hemoglobin 11.0 (*) 12.2 - 16.2 g/dL   HCT, POC 16.135.1 (*) 09.637.7 - 47.9 %   MCV 84.4  80 - 97 fL   MCH, POC 26.4 (*) 27 - 31.2 pg   MCHC 31.3 (*) 31.8 - 35.4 g/dL   RDW, POC 04.515.2     Platelet Count, POC 628 (*) 142 - 424 K/uL   MPV 7.4  0 - 99.8 fL  POCT URINE PREGNANCY      Result Value Ref Range   Preg Test, Ur Negative      BP 112/60  Pulse 78  Temp(Src) 98.4 F (36.9 C) (Oral)  Resp 14  Ht 5' 1.75" (1.568 m)  Wt 108 lb (48.988 kg)  BMI 19.92 kg/m2  SpO2 98%  LMP 02/07/2014 Assessment & Plan:  Anemia - Plan: POCT urinalysis dipstick, POCT CBC, Comprehensive metabolic panel, TSH, Pap IG, CT/NG w/ reflex HPV when ASC-U, POCT urine pregnancy, HIV antibody, RPR, CANCELED: HIV  antibody, CANCELED: RPR  Contraception management - Plan: etonogestrel-ethinyl estradiol (NUVARING) 0.12-0.015 MG/24HR vaginal ring  Irregular menses  Routine gynecological examination  1.  Gynecological exam:  Pap smear obtained.  No previous Gardisil.  2.  Contraception Management:  rx for Nuvaring provided. 3.  Irregular menses: New. Obtain TSH, Prolactin.  Rx for Provera provided to start menses.  RTC if irregular menses persist. 4. Anemia:  Improving; continue with iron rich foods.  5.  S/p tDAP.  Meds ordered this encounter  Medications  . diphenhydramine-acetaminophen (TYLENOL PM) 25-500 MG TABS    Sig: Take 1 tablet by mouth at bedtime as needed.  . etonogestrel-ethinyl estradiol (NUVARING) 0.12-0.015 MG/24HR vaginal ring    Sig: Insert vaginally and leave in place for 3 consecutive weeks, then remove for 1 week.    Dispense:  1 each    Refill:  12  . medroxyPROGESTERone (PROVERA) 10 MG tablet    Sig: Take 1 tablet (10 mg total) by mouth daily.    Dispense:  10 tablet    Refill:  0   I personally performed the services described in this documentation, which was scribed in my presence.  The recorded information has been reviewed and is accurate.  Claudia SimmerKristi Jackson, M.D.  [facility]  [place] [address] [place], [place]  [id number] [phone] phone [phone] fax

## 2014-03-17 NOTE — Patient Instructions (Signed)
1.  TAKE PROVERA 10MG  ONE TABLET DAILY FOR TEN DAYS; WHEN YOU STOP THE MEDICATION, YOU WILL HAVE YOUR PERIOD. 2. THEN START NUVARING THE Sunday AFTER STARTING YOUR PERIOD.

## 2014-03-19 LAB — RPR: RPR: NONREACTIVE

## 2014-03-19 LAB — COMPREHENSIVE METABOLIC PANEL
A/G RATIO: 1.6 (ref 1.1–2.5)
ALK PHOS: 82 IU/L (ref 39–117)
ALT: 10 IU/L (ref 0–32)
AST: 19 IU/L (ref 0–40)
Albumin: 4.7 g/dL (ref 3.5–5.5)
BUN / CREAT RATIO: 9 (ref 8–20)
BUN: 7 mg/dL (ref 6–20)
CALCIUM: 9.4 mg/dL (ref 8.7–10.2)
CHLORIDE: 104 mmol/L (ref 97–108)
CO2: 22 mmol/L (ref 18–29)
CREATININE: 0.74 mg/dL (ref 0.57–1.00)
GFR calc Af Amer: 127 mL/min/{1.73_m2} (ref >59)
GFR, EST NON AFRICAN AMERICAN: 110 mL/min/{1.73_m2} (ref >59)
GLUCOSE: 76 mg/dL (ref 65–99)
Globulin, Total: 3 g/dL (ref 1.5–4.5)
POTASSIUM: 4.3 mmol/L (ref 3.5–5.2)
Sodium: 140 mmol/L (ref 134–144)
Total Bilirubin: 0.4 mg/dL (ref 0.0–1.2)
Total Protein: 7.7 g/dL (ref 6.0–8.5)

## 2014-03-19 LAB — PROLACTIN: PROLACTIN: 25.5 ng/mL — AB (ref 4.8–23.3)

## 2014-03-19 LAB — TSH: TSH: 2.2 u[IU]/mL (ref 0.450–4.500)

## 2014-03-19 LAB — HIV ANTIBODY (ROUTINE TESTING W REFLEX): HIV 1/HIV 2 AB: NONREACTIVE

## 2014-03-21 ENCOUNTER — Telehealth: Payer: Self-pay

## 2014-03-21 NOTE — Telephone Encounter (Signed)
PATIENT CALLED STATING SHE WAS GIVEN MEDICATION TO INDUCE MENSTRUATION, STATES SHE THINKS SHE IS HAVING A REACTION BECAUSE DR ADVISED HER THAT "CYCLE WOULD START 10-12 DAYS AFTER TAKING MEDICATION", HOWEVER, PATIENT STARTED CYCLE AFTER DAY 4.  ADVISED PATIENT IF SHE THINKS SHE IS HAVING A REACTION OF ANY TYPE, TO COME IN TO BE EVALUATED, PATIENT DECLINED.

## 2014-03-21 NOTE — Telephone Encounter (Signed)
Spoke to patient.  Advised her that it is ok that she started her cycle after 4 days.  Take medication as directed.  Use NuvaRing as directed and RTC if she continues to have irregular menses.  Patient verbalized understanding.

## 2014-03-21 NOTE — Telephone Encounter (Signed)
It is ok that her cycle started 4 days after starting the medication.  Take medication as directed.  Use NuvaRIng as directed.  RTC if continuing to have menstrual irregularities.

## 2014-03-22 LAB — PAP IG, CT-NG, RFX HPV ASCU
Chlamydia, Nuc. Acid Amp: NEGATIVE
Gonococcus by Nucleic Acid Amp: NEGATIVE
PAP SMEAR COMMENT: 0

## 2014-04-03 ENCOUNTER — Telehealth: Payer: Self-pay

## 2014-04-03 NOTE — Telephone Encounter (Signed)
Pt is having questions about her birth control side effects

## 2014-04-03 NOTE — Telephone Encounter (Signed)
NuvaRing. Has used it before without adverse effects. Describes a dull menstrual cramp feeling. Inserted this ring on Monday 04/01/2014.  Advised that this is unusual for this product.  Remove the ring.  If the pain persists, RTC for additional evaluation.    If pain resolves, she may retry the NuvaRing.  The patient called back a minute later, reporting a vaginal odor that started with the pain.  Revised my advice, and encouraged her to come in for evaluation, as she may have a vaginal infection totally unrelated to the NuvaRing..Marland Kitchen

## 2014-04-04 NOTE — Addendum Note (Signed)
Addended by: Ethelda ChickSMITH, KRISTI M on: 04/04/2014 09:15 AM   Modules accepted: Orders

## 2014-05-02 ENCOUNTER — Ambulatory Visit (INDEPENDENT_AMBULATORY_CARE_PROVIDER_SITE_OTHER): Payer: 59 | Admitting: Gynecology

## 2014-05-02 ENCOUNTER — Encounter: Payer: Self-pay | Admitting: Gynecology

## 2014-05-02 VITALS — BP 106/70 | Ht 62.0 in | Wt 106.0 lb

## 2014-05-02 DIAGNOSIS — Z309 Encounter for contraceptive management, unspecified: Secondary | ICD-10-CM

## 2014-05-02 DIAGNOSIS — N870 Mild cervical dysplasia: Secondary | ICD-10-CM

## 2014-05-02 DIAGNOSIS — D500 Iron deficiency anemia secondary to blood loss (chronic): Secondary | ICD-10-CM | POA: Insufficient documentation

## 2014-05-02 DIAGNOSIS — N920 Excessive and frequent menstruation with regular cycle: Secondary | ICD-10-CM

## 2014-05-02 NOTE — Patient Instructions (Signed)
Levonorgestrel intrauterine device (IUD) What is this medicine? LEVONORGESTREL IUD (LEE voe nor jes trel) is a contraceptive (birth control) device. The device is placed inside the uterus by a healthcare professional. It is used to prevent pregnancy and can also be used to treat heavy bleeding that occurs during your period. Depending on the device, it can be used for 3 to 5 years. This medicine may be used for other purposes; ask your health care Pheonix Wisby or pharmacist if you have questions. COMMON BRAND NAME(S): Mirena, Skyla What should I tell my health care Luismiguel Lamere before I take this medicine? They need to know if you have any of these conditions: -abnormal Pap smear -cancer of the breast, uterus, or cervix -diabetes -endometritis -genital or pelvic infection now or in the past -have more than one sexual partner or your partner has more than one partner -heart disease -history of an ectopic or tubal pregnancy -immune system problems -IUD in place -liver disease or tumor -problems with blood clots or take blood-thinners -use intravenous drugs -uterus of unusual shape -vaginal bleeding that has not been explained -an unusual or allergic reaction to levonorgestrel, other hormones, silicone, or polyethylene, medicines, foods, dyes, or preservatives -pregnant or trying to get pregnant -breast-feeding How should I use this medicine? This device is placed inside the uterus by a health care professional. Talk to your pediatrician regarding the use of this medicine in children. Special care may be needed. Overdosage: If you think you have taken too much of this medicine contact a poison control center or emergency room at once. NOTE: This medicine is only for you. Do not share this medicine with others. What if I miss a dose? This does not apply. What may interact with this medicine? Do not take this medicine with any of the following  medications: -amprenavir -bosentan -fosamprenavir This medicine may also interact with the following medications: -aprepitant -barbiturate medicines for inducing sleep or treating seizures -bexarotene -griseofulvin -medicines to treat seizures like carbamazepine, ethotoin, felbamate, oxcarbazepine, phenytoin, topiramate -modafinil -pioglitazone -rifabutin -rifampin -rifapentine -some medicines to treat HIV infection like atazanavir, indinavir, lopinavir, nelfinavir, tipranavir, ritonavir -St. John's wort -warfarin This list may not describe all possible interactions. Give your health care Natalina Wieting a list of all the medicines, herbs, non-prescription drugs, or dietary supplements you use. Also tell them if you smoke, drink alcohol, or use illegal drugs. Some items may interact with your medicine. What should I watch for while using this medicine? Visit your doctor or health care professional for regular check ups. See your doctor if you or your partner has sexual contact with others, becomes HIV positive, or gets a sexual transmitted disease. This product does not protect you against HIV infection (AIDS) or other sexually transmitted diseases. You can check the placement of the IUD yourself by reaching up to the top of your vagina with clean fingers to feel the threads. Do not pull on the threads. It is a good habit to check placement after each menstrual period. Call your doctor right away if you feel more of the IUD than just the threads or if you cannot feel the threads at all. The IUD may come out by itself. You may become pregnant if the device comes out. If you notice that the IUD has come out use a backup birth control method like condoms and call your health care Ahmaad Neidhardt. Using tampons will not change the position of the IUD and are okay to use during your period. What side effects may I   notice from receiving this medicine? Side effects that you should report to your doctor or  health care professional as soon as possible: -allergic reactions like skin rash, itching or hives, swelling of the face, lips, or tongue -fever, flu-like symptoms -genital sores -high blood pressure -no menstrual period for 6 weeks during use -pain, swelling, warmth in the leg -pelvic pain or tenderness -severe or sudden headache -signs of pregnancy -stomach cramping -sudden shortness of breath -trouble with balance, talking, or walking -unusual vaginal bleeding, discharge -yellowing of the eyes or skin Side effects that usually do not require medical attention (report to your doctor or health care professional if they continue or are bothersome): -acne -breast pain -change in sex drive or performance -changes in weight -cramping, dizziness, or faintness while the device is being inserted -headache -irregular menstrual bleeding within first 3 to 6 months of use -nausea This list may not describe all possible side effects. Call your doctor for medical advice about side effects. You may report side effects to FDA at 1-800-FDA-1088. Where should I keep my medicine? This does not apply. NOTE: This sheet is a summary. It may not cover all possible information. If you have questions about this medicine, talk to your doctor, pharmacist, or health care Hatsuko Bizzarro.  2015, Elsevier/Gold Standard. (2011-12-02 13:54:04) Colposcopy Colposcopy is a procedure to examine your cervix and vagina, or the area around the outside of your vagina, for abnormalities or signs of disease. The procedure is done using a lighted microscope called a colposcope. Tissue samples may be collected during the colposcopy if your health care Melinda Gwinner finds any unusual cells. A colposcopy may be done if a woman has:  An abnormal Pap test. A Pap test is a medical test done to evaluate cells that are on the surface of the cervix.  A Pap test result that is suggestive of human papillomavirus (HPV). This virus can cause  genital warts and is linked to the development of cervical cancer.  A sore on her cervix and the results of a Pap test were normal.  Genital warts on the cervix or in or around the outside of the vagina.  A mother who took the drug diethylstilbestrol (DES) while pregnant.  Painful intercourse.  Vaginal bleeding, especially after sexual intercourse. LET [facility] Ivey Cina KNOW ABOUT:  Any allergies you have.  All medicines you are taking, including vitamins, herbs, eye drops, creams, and over-the-counter medicines.  Previous problems you or members of your family have had with the use of anesthetics.  Any blood disorders you have.  Previous surgeries you have had.  Medical conditions you have. RISKS AND COMPLICATIONS Generally, a colposcopy is a safe procedure. However, as with any procedure, complications can occur. Possible complications include:  Bleeding.  Infection.  Missed lesions. BEFORE THE PROCEDURE   Tell your health care Heather Streeper if you have your menstrual period. A colposcopy typically is not done during menstruation.  For 24 hours before the colposcopy, do not:  [place].  Use tampons.  Use medicines, creams, or suppositories in the vagina.  Have sexual intercourse. PROCEDURE  During the procedure, you will be lying on your back with your feet in foot rests (stirrups). A warm metal or plastic instrument (speculum) will be placed in your vagina to keep it open and to allow the health care Rolland Steinert to see the cervix. The colposcope will be placed outside the vagina. It will be used to magnify and examine the cervix, vagina, and the area around the outside  of the vagina. A small amount of liquid solution will be placed on the area that is to be viewed. This solution will make it easier to see the abnormal cells. Your health care Anetha Slagel will use tools to suck out mucus and cells from the canal of the cervix. Then he or she will record the location of the  abnormal areas. If a biopsy is done during the procedure, a medicine will usually be given to numb the area (local anesthetic). You may feel mild pain or cramping while the biopsy is done. After the procedure, tissue samples collected during the biopsy will be sent to a lab for analysis. AFTER THE PROCEDURE  You will be given instructions on when to follow up with your health care Sumner Kirchman for your test results. It is important to keep your appointment. Document Released: 01/22/2003 Document Revised: 07/04/2013 Document Reviewed: 05/31/2013 [facility] Patient Information 2015 [employer] [place]. This information is not intended to replace advice given to you by your health care Jadarius Commons. Make sure you discuss any questions you have with your health care Herberth Deharo.

## 2014-05-02 NOTE — Progress Notes (Signed)
The patient is a 28 year old who was referred to our practice as a courtesy of her PCP Dr. 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 Patient stated that many years ago she had an abnormal Pap smear but followup Pap smears were normal and no intervention had been required. Her PCP also has been recently following an elevated prolactin level as well as her anemia. She suffers from heavy cycles lasting 5 days and her recent hemoglobin was 11.0. She is not using any form of contraception at the present time and wanted to discuss options.  Physical Exam  Genitourinary:     Cervical biopsies were obtained at the 1, 5, and 7:00 ectocervical position where the lesions were noted. The endocervical speculum was utilized to evaluate the complete transformation zone. An ECC was obtained as well. Monsel solution and silver nitrate were used for hemostasis.  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 We will wait for the colposcopic directed biopsy pathology report and plan course of management. Literature information was provided. #2 menorrhagia/anemia. We discussed that the Mirena IUD would be a good option for her first cycle control as well as minimize her blood loss to help correct her anemia as well as for contraception. Literature information was also provided and she will decide and schedule accordingly.

## 2014-05-02 NOTE — Progress Notes (Deleted)
Patient is a 28 year old who was referred for practice as a courtesy of her PCP Dr. 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 Patient stated that many years ago she had an abnormal Pap smear but followup Pap smear was normal and no intervention was required. Her PCP is also followed her for recent prolactin elevation and has a followup with her. She also suffers from heavy cycles last about 5 days and had a recent hemoglobin with a value of 11. She does suffer from anemia. She is not using any form of contraception angle was interested in discussing options.  Colposcopic evaluation today: Physical Exam  Genitourinary:     Correction of the lesion #3 was at the 5:00 position.  Cervical biopsies were obtained from the 1, 5, and 7:00 ectocervical position with the lesions were noted. The endocervical speculum was utilized to evaluate the complete transformation zone. An ECC was also obtained. Monsel solution and silver nitrate were used for hemostasis.  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 direct relation and will manage accordingly. Literature information was provided. #2 menorrhagia /anemia we discussed the Mirena IUD would be ideal for her for cycle control as well as for contraception. She will read information provided and decide which route that she would like to proceed and schedule accordingly.

## 2014-05-02 NOTE — Progress Notes (Deleted)
   Patient is a 28 year old who was referred for practice as a courtesy of her PCP Dr. 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 Patient stated that many years ago she had an abnormal Pap smear but followup Pap smear was normal and no intervention was required. Her PCP is also followed her for recent prolactin elevation and has a followup with her. She also suffers from heavy cycles last about 5 days and had a recent hemoglobin with a value of 11. She does suffer from anemia. She is not using any form of contraception angle was interested in discussing options.  Colposcopic evaluation today: Physical Exam  Genitourinary:     Correction of the lesion #3 was at the 5:00 position.  Cervical biopsies were obtained from the 1, 5, and 7:00 ectocervical position with the lesions were noted. The endocervical speculum was utilized to evaluate the complete transformation zone. An ECC was also obtained. Monsel solution and silver nitrate were used for hemostasis.  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 direct relation and will manage accordingly. Literature information was provided. #2 menorrhagia /anemia we discussed the Mirena IUD would be ideal for her for cycle control as well as for contraception. She will read information provided and decide which route that she would like to proceed and schedule accordingly.

## 2014-05-08 ENCOUNTER — Telehealth: Payer: Self-pay

## 2014-05-08 NOTE — Telephone Encounter (Signed)
[facility] to pt- [facility]. She is wondering if she can come in to have the blood work done- Prolactin again in 1 month from last visit. As of right now she is still not sexually active and her menses has not started. She is thinking that she may be off due stopping BC. Advised pt to come in this is probably from the bc. She is going to come in to see you tomorrow. She has done 2 pregnancy test and both were negative just in case.

## 2014-05-08 NOTE — Telephone Encounter (Signed)
Dr. Katrinka BlazingSmith:  Patient has a question about her Prolactin and her cycle has not come this month.  Please call her at [phone]

## 2014-05-09 ENCOUNTER — Ambulatory Visit (INDEPENDENT_AMBULATORY_CARE_PROVIDER_SITE_OTHER): Payer: 59 | Admitting: Family Medicine

## 2014-05-09 VITALS — BP 94/66 | HR 85 | Temp 98.3°F | Resp 16 | Ht 62.0 in | Wt 108.6 lb

## 2014-05-09 DIAGNOSIS — D649 Anemia, unspecified: Secondary | ICD-10-CM

## 2014-05-09 DIAGNOSIS — E229 Hyperfunction of pituitary gland, unspecified: Secondary | ICD-10-CM

## 2014-05-09 DIAGNOSIS — S00502A Unspecified superficial injury of oral cavity, initial encounter: Secondary | ICD-10-CM

## 2014-05-09 DIAGNOSIS — K0510 Chronic gingivitis, plaque induced: Secondary | ICD-10-CM

## 2014-05-09 DIAGNOSIS — N912 Amenorrhea, unspecified: Secondary | ICD-10-CM

## 2014-05-09 DIAGNOSIS — E221 Hyperprolactinemia: Secondary | ICD-10-CM

## 2014-05-09 LAB — POCT URINE PREGNANCY: Preg Test, Ur: NEGATIVE

## 2014-05-09 MED ORDER — AMOXICILLIN 500 MG PO TABS: 1000.0000 mg | ORAL_TABLET | Freq: Two times a day (BID) | ORAL | Status: DC

## 2014-05-09 MED ORDER — MEDROXYPROGESTERONE ACETATE 10 MG PO TABS
10.0000 mg | ORAL_TABLET | Freq: Every day | ORAL | Status: DC
Start: 2014-05-09 — End: 2014-07-19

## 2014-05-09 NOTE — Patient Instructions (Signed)
1.  If your period has not started in 3 days, repeat pregnancy test.  If negative, start medroxyprovera/Provera one tablet daily for ten days.

## 2014-05-09 NOTE — Telephone Encounter (Signed)
Noted and agree for follow-up for repeat Prolactin level.

## 2014-05-09 NOTE — Progress Notes (Addendum)
Subjective:  This chart was scribed for Claudia Simmer, MD by Claudia Jackson, Medical Scribe. This patient was seen in Room 2 and the patient's care was started at 8:06 PM.   Patient ID: Claudia Jackson, female    DOB: [birth year], 28 y.o.   MRN: [id number]  HPI HPI Comments: Claudia Jackson is a 28 y.o. female who presents to the [facility] for a follow-up appointment after being seen by the gynecologist Dr. Lily Jackson on May 02, 2014 for LGSIL on pap smear.  S/p colposcopy.  She states that her Prolactin was 25 at [facility].  The patient states that she is still not had a menstrual cycle since Apr 06, 2014.  The patient states that her menstrual cycle in May was normal.  The patient denies vaginal discharge as an associated symptom.  She states that she has taken 2 pregnancy tests at home which have been negative.  She states that she is not worried that she is pregnant.  The patient states that she is currently sexually active but has stopped using her NuvaRing on Apr 03, 2014 after she experienced abdominal pain lower.  The patient states that she last had sex on April 19, 2014 and she used condoms.  She denies experiencing any side effects when she took the medication/provera prescribed by Dr. Katrinka Blazing at [facility] to start menses.   The patient is also complaining of two infected oral abscesses that she noticed a month ago.  She states that she went to the dentist who informed her of the abscesses when they were taking an x-ray but she was not given any antibiotics for her symptoms.  She denies ear pain, headache, blurred vision, and sore throat as associated symptoms.  The patient states that her abscesses will swell when she eats.  She states that brushing her teeth aggravates the pain.  She states that she has not tried to drain them.    Past Medical History  Diagnosis Date   Allergy    Anemia    History reviewed. No pertinent past surgical history. Family History  Problem  Relation Age of Onset   Cancer Brother     Hodgkin;s Lymphoma   Cancer Paternal Aunt     BRAIN   Breast cancer Maternal Grandmother    Cancer Maternal Grandmother     BRAIN   History   Social History   Marital Status: Single    Spouse Name: N/A    Number of Children: N/A   Years of Education: N/A   Occupational History   Not on file.   Social History Main Topics   Smoking status: Never Smoker    Smokeless tobacco: Not on file   Alcohol Use: Yes   Drug Use: No   Sexual Activity: Yes   Other Topics Concern   Not on file   Social History Narrative   Marital status: single; dating casually.      Children: 1 son (8yo)      Lives: with parents, son      Employment: Paramedic; billing; since 2014.      Tobacco: none      Alcohol: socially; no DWIs.      Drugs: none      Exercise: none      Seatbelt: 100%      Guns: none      Sexually:  Total partners = 4; no STDs.  Condoms 90%   No Known Allergies  Review of Systems  Constitutional: Negative for appetite change.  HENT: Positive for mouth sores. Negative for ear pain and sore throat.   Eyes: Negative for visual disturbance.  Genitourinary: Positive for menstrual problem. Negative for vaginal discharge.  Skin: Negative for rash.  Neurological: Negative for dizziness, light-headedness and headaches.  Hematological: Negative for adenopathy. Does not bruise/bleed easily.  All other systems reviewed and are negative.    Objective:  Physical Exam  Nursing note and vitals reviewed. Constitutional: She is oriented to person, place, and time. She appears well-developed and well-nourished. No distress.  HENT:  Head: Normocephalic and atraumatic.  Right Ear: External ear normal.  Left Ear: External ear normal.  Mouth/Throat: Oropharynx is clear and moist. Dental abscesses present. No oropharyngeal exudate.  fluctuant 4 mm x 4 mm superficial areas along the left upper gum line and the right upper gum  line.  Eyes: Conjunctivae and EOM are normal. Pupils are equal, round, and reactive to light.  Neck: Normal range of motion. Neck supple. No thyromegaly present.  Cardiovascular: Normal rate, regular rhythm, normal heart sounds and intact distal pulses.  Exam reveals no gallop and no friction rub.   No murmur heard. Pulmonary/Chest: Effort normal and breath sounds normal. No respiratory distress. She has no wheezes. She has no rales.  Abdominal: Soft. Bowel sounds are normal. She exhibits no distension and no mass. There is no tenderness. There is no rebound and no guarding.  Musculoskeletal: Normal range of motion.  Lymphadenopathy:    She has no cervical adenopathy.  Neurological: She is alert and oriented to person, place, and time.  Skin: Skin is warm and dry. No rash noted. She is not diaphoretic.  Psychiatric: She has a normal mood and affect. Her behavior is normal.   Results for orders placed in visit on 05/09/14  POCT URINE PREGNANCY      Result Value Ref Range   Preg Test, Ur Negative       BP 94/66   Pulse 85   Temp(Src) 98.3 F (36.8 C) (Oral)   Resp 16   Ht 5\' 2"  (1.575 m)   Wt 108 lb 9.6 oz (49.261 kg)   BMI 19.86 kg/m2   SpO2 100%   LMP 04/01/2014 Assessment & Plan:  Amenorrhea - Plan: POCT urine pregnancy, Prolactin, medroxyPROGESTERone (PROVERA) 10 MG tablet, hCG, quantitative, pregnancy  Hyperprolactinemia - Plan: Prolactin  Anemia, unspecified - Plan: CBC with Differential  Superficial injury of gum with infection, initial encounter  1. Amenorrhea:  New.  Urine pregnancy negative; sent hcg quant.  If negative, can start Provera to start menses.  After completing provera, patient to start Nuvaring again. 2.  Hyperprolactinemia:  New.  Borderline elevated; repeat today. If elevated again, will warrant MRI brain. 3.  Anemia: stable; obtain labs.  Recommend iron supplement during menses. 4.  Gun irritation with infection:  New.  Rx for Amoxicillin provided; if no  improvement with Amoxicillin, will need to follow-up with dentist.  Meds ordered this encounter  Medications   medroxyPROGESTERone (PROVERA) 10 MG tablet    Sig: Take 1 tablet (10 mg total) by mouth daily.    Dispense:  10 tablet    Refill:  0   amoxicillin (AMOXIL) 500 MG tablet    Sig: Take 2 tablets (1,000 mg total) by mouth 2 (two) times daily.    Dispense:  40 tablet    Refill:  0   I personally performed the services described in this documentation, which was scribed in my presence.  The recorded information has been reviewed and is accurate.  Claudia SimmerKristi Smith, M.D.  [facility]   [place] [address] [place], [place]  [id number] [phone] phone [phone] fax

## 2014-05-11 LAB — CBC WITH DIFFERENTIAL/PLATELET
Basophils Absolute: 0.2 10*3/uL (ref 0.0–0.2)
Basos: 2 %
Eos: 10 %
Eosinophils Absolute: 0.8 10*3/uL — ABNORMAL HIGH (ref 0.0–0.4)
HEMATOCRIT: 33.7 % — AB (ref 34.0–46.6)
Hemoglobin: 10.8 g/dL — ABNORMAL LOW (ref 11.1–15.9)
IMMATURE GRANULOCYTES: 0 %
Immature Grans (Abs): 0 10*3/uL (ref 0.0–0.1)
LYMPHS ABS: 4 10*3/uL — AB (ref 0.7–3.1)
Lymphs: 45 %
MCH: 25.5 pg — ABNORMAL LOW (ref 26.6–33.0)
MCHC: 32 g/dL (ref 31.5–35.7)
MCV: 80 fL (ref 79–97)
MONOS ABS: 0.5 10*3/uL (ref 0.1–0.9)
Monocytes: 6 %
NEUTROS PCT: 37 %
Neutrophils Absolute: 3.2 10*3/uL (ref 1.4–7.0)
RBC: 4.23 x10E6/uL (ref 3.77–5.28)
RDW: 14.7 % (ref 12.3–15.4)
WBC: 8.7 10*3/uL (ref 3.4–10.8)

## 2014-05-11 LAB — PROLACTIN: PROLACTIN: 25 ng/mL — AB (ref 4.8–23.3)

## 2014-05-11 LAB — HCG, QUANTITATIVE, PREGNANCY: hCG Quant: <1 m[IU]/mL

## 2014-05-14 ENCOUNTER — Encounter: Payer: Self-pay | Admitting: Family Medicine

## 2014-07-19 ENCOUNTER — Ambulatory Visit (INDEPENDENT_AMBULATORY_CARE_PROVIDER_SITE_OTHER): Payer: 59 | Admitting: Physician Assistant

## 2014-07-19 VITALS — BP 102/60 | HR 90 | Temp 98.5°F | Ht 62.0 in | Wt 105.0 lb

## 2014-07-19 DIAGNOSIS — R300 Dysuria: Secondary | ICD-10-CM

## 2014-07-19 DIAGNOSIS — Z7251 High risk heterosexual behavior: Secondary | ICD-10-CM

## 2014-07-19 DIAGNOSIS — Z3009 Encounter for other general counseling and advice on contraception: Secondary | ICD-10-CM

## 2014-07-19 DIAGNOSIS — Z23 Encounter for immunization: Secondary | ICD-10-CM

## 2014-07-19 DIAGNOSIS — Z30011 Encounter for initial prescription of contraceptive pills: Secondary | ICD-10-CM

## 2014-07-19 LAB — POCT URINALYSIS DIPSTICK
BILIRUBIN UA: NEGATIVE
Glucose, UA: NEGATIVE
Ketones, UA: NEGATIVE
Nitrite, UA: NEGATIVE
PH UA: 6
Protein, UA: NEGATIVE
SPEC GRAV UA: 1.015
Urobilinogen, UA: 0.2

## 2014-07-19 LAB — POCT UA - MICROSCOPIC ONLY
BACTERIA, U MICROSCOPIC: NEGATIVE
CRYSTALS, UR, HPF, POC: NEGATIVE
Casts, Ur, LPF, POC: NEGATIVE
Mucus, UA: NEGATIVE
Yeast, UA: NEGATIVE

## 2014-07-19 LAB — POCT WET PREP WITH KOH
BACTERIA WET PREP HPF POC: NEGATIVE
Clue Cells Wet Prep HPF POC: NEGATIVE
KOH PREP POC: NEGATIVE
Trichomonas, UA: NEGATIVE
YEAST WET PREP PER HPF POC: NEGATIVE

## 2014-07-19 LAB — POCT URINE PREGNANCY: Preg Test, Ur: NEGATIVE

## 2014-07-19 MED ORDER — SULFAMETHOXAZOLE-TRIMETHOPRIM 800-160 MG PO TABS: 1.0000 | ORAL_TABLET | Freq: Two times a day (BID) | ORAL | Status: DC

## 2014-07-19 MED ORDER — NORETHIN ACE-ETH ESTRAD-FE 1.5-30 MG-MCG PO TABS: 1.0000 | ORAL_TABLET | Freq: Every day | ORAL | Status: DC

## 2014-07-19 NOTE — Progress Notes (Signed)
Subjective:    Patient ID: Claudia Jackson, female    DOB: June 16, 1986, 28 y.o.   MRN: [id number]   PCP: Default, Provider, MD  Chief Complaint  Patient presents with  . Dysuria  . Exposure to STD    Medications, allergies, past medical history, surgical history, family history, social history and problem list reviewed and updated.  Patient Active Problem List   Diagnosis Date Noted  . Menorrhagia with regular cycle 05/02/2014  . Cervical dysplasia, mild 05/02/2014  . Iron deficiency anemia due to chronic blood loss 05/02/2014    Prior to Admission medications   Not on File    HPI  This 28 y.o. female presents for evaluation of possible UTI. In addition, she would like to make sure she is not pregnant and get STI testing. 7 days ago, she had unprotected sex. She has been using condoms, but inconsistently. She previously used Paediatric nurse but reports it caused "stomach pain." She is interested in restarting COC pills.  Dysuria began about a week ago, and feels like UTI she's had previously. No vaginal discharge. Using [facility] [employer] was helping, but she forgot to use it one day and her symptoms became much worse. No lesions. No swollen lymph nodes.   Review of Systems     Objective:   Physical Exam  Constitutional: She is oriented to person, place, and time. She appears well-developed and well-nourished. She is active and cooperative. No distress.  BP 102/60  Pulse 90  Temp(Src) 98.5 F (36.9 C)  Ht  (1.575 m)  Wt 105 lb (47.628 kg)  BMI 19.20 kg/m2  SpO2 99%  LMP 07/10/2014   Pulmonary/Chest: Effort normal and breath sounds normal.  Abdominal: Soft. Bowel sounds are normal. There is no tenderness. Hernia confirmed negative in the right inguinal area and confirmed negative in the left inguinal area.  Genitourinary: Rectal exam shows no external hemorrhoid and no fissure. Pelvic exam was performed with patient supine. No labial fusion. There is no rash,  tenderness, lesion or injury on the right labia. There is no rash, tenderness, lesion or injury on the left labia. Uterus is not deviated, not fixed and not tender. Cervix exhibits no motion tenderness, no discharge and no friability. Right adnexum displays no mass, no tenderness and no fullness. Left adnexum displays no mass, no tenderness and no fullness. No erythema, tenderness or bleeding around the vagina. No foreign body around the vagina. No signs of injury around the vagina. No vaginal discharge found.  Lymphadenopathy:       Right: No inguinal adenopathy present.       Left: No inguinal adenopathy present.  Neurological: She is alert and oriented to person, place, and time.  Skin: Skin is warm and dry.  Psychiatric: She has a normal mood and affect. Her speech is normal and behavior is normal.      Results for orders placed in visit on 07/19/14  POCT URINE PREGNANCY      Result Value Ref Range   Preg Test, Ur Negative    POCT URINALYSIS DIPSTICK      Result Value Ref Range   Color, UA yellow     Clarity, UA clear     Glucose, UA neg     Bilirubin, UA neg     Ketones, UA neg     Spec Grav, UA 1.015     Blood, UA large     pH, UA 6.0     Protein, UA neg  Urobilinogen, UA 0.2     Nitrite, UA neg     Leukocytes, UA small (1+)    POCT UA - MICROSCOPIC ONLY      Result Value Ref Range   WBC, Ur, HPF, POC 4-6     RBC, urine, microscopic 8-10     Bacteria, U Microscopic neg     Mucus, UA neg     Epithelial cells, urine per micros 1-4     Crystals, Ur, HPF, POC neg     Casts, Ur, LPF, POC neg     Yeast, UA neg    POCT WET PREP WITH KOH      Result Value Ref Range   Trichomonas, UA Negative     Clue Cells Wet Prep HPF POC neg     Epithelial Wet Prep HPF POC 6-12     Yeast Wet Prep HPF POC neg     Bacteria Wet Prep HPF POC neg     RBC Wet Prep HPF POC 0-2     WBC Wet Prep HPF POC 3-6     KOH Prep POC Negative         Assessment & Plan:  1. Dysuria Cover for UTI.  Await UCx. - POCT urinalysis dipstick - POCT UA - Microscopic Only - Urine culture - sulfamethoxazole-trimethoprim (BACTRIM DS,SEPTRA DS) 800-160 MG per tablet; Take 1 tablet by mouth 2 (two) times daily.  Dispense: 10 tablet; Refill: 0  2. High risk sexual behavior Counseled on safer sex practices, consistent condom use. Anticipate that all will be negative, and advise repeat HIV in 3 months. - GC/Chlamydia Probe Amp - Hepatitis B surface antibody - Hepatitis C antibody - Hepatitis B surface antigen - HIV antibody - HSV(herpes simplex vrs) 1+2 ab-IgG - RPR - POCT Wet Prep with KOH  3. Need for influenza vaccination - Flu Vaccine QUAD 36+ mos IM  4. Encounter for initial prescription of contraceptive pills Start COC today or the Sunday following the start of her next menstrual period. - POCT urine pregnancy - norethindrone-ethinyl estradiol-iron (MICROGESTIN FE,GILDESS FE,LOESTRIN FE) 1.5-30 MG-MCG tablet; Take 1 tablet by mouth daily.  Dispense: 3 Package; Refill: 4   Fernande Bras, PA-C Physician Assistant-Certified [facility]

## 2014-07-21 LAB — URINE CULTURE: Colony Count: 3000

## 2014-07-22 ENCOUNTER — Telehealth: Payer: Self-pay

## 2014-07-22 NOTE — Telephone Encounter (Signed)
PATIENT WANTS TO SPEAK TO CHELLE AND ONLY CHELLE.  BEST PHONE [phone]  [facility]

## 2014-07-23 NOTE — Telephone Encounter (Signed)
Spoke to pt- she is upset that her record indicated she was here for treatment of an STI. Pt was screened for STI at her last OV and not treated. She states she has already talked to someone here in regards to this.

## 2014-07-24 ENCOUNTER — Encounter: Payer: Self-pay | Admitting: Physician Assistant

## 2014-07-24 DIAGNOSIS — Z789 Other specified health status: Secondary | ICD-10-CM | POA: Insufficient documentation

## 2014-07-24 LAB — HSV(HERPES SIMPLEX VRS) I + II AB-IGG
HAV 1 IGG,TYPE SPECIFIC AB: 52.3 index — ABNORMAL HIGH (ref 0.00–0.90)
HSV 2 IGG,TYPE SPECIFIC AB: <0.91 index (ref 0.00–0.90)

## 2014-07-24 LAB — HIV ANTIBODY (ROUTINE TESTING W REFLEX): HIV 1/HIV 2 AB: NONREACTIVE

## 2014-07-24 LAB — HEPATITIS B SURFACE ANTIBODY, QUANTITATIVE: Hepatitis B Surf Ab Quant: 27 m[IU]/mL (ref >9.9)

## 2014-07-24 LAB — RPR: RPR: NONREACTIVE

## 2014-07-24 LAB — HEPATITIS B SURFACE ANTIGEN: Hepatitis B Surface Ag: NEGATIVE

## 2014-07-24 LAB — HEPATITIS C ANTIBODY: Hep C Virus Ab: <0.1 s/co ratio (ref 0.0–0.9)

## 2014-07-25 LAB — GC/CHLAMYDIA PROBE AMP
Chlamydia trachomatis, NAA: NEGATIVE
Neisseria gonorrhoeae by PCR: NEGATIVE

## 2014-09-29 ENCOUNTER — Telehealth: Payer: Self-pay

## 2014-09-29 MED ORDER — IBUPROFEN 800 MG PO TABS: 800.0000 mg | ORAL_TABLET | Freq: Three times a day (TID) | ORAL | Status: DC | PRN

## 2014-09-29 NOTE — Telephone Encounter (Signed)
Meds ordered this encounter  Medications  . ibuprofen (ADVIL,MOTRIN) 800 MG tablet    Sig: Take 1 tablet (800 mg total) by mouth every 8 (eight) hours as needed.    Dispense:  30 tablet    Refill:  0    Order Specific Question:  Supervising Provider    Answer:  DOOLITTLE, ROBERT P [[id number]]

## 2014-09-29 NOTE — Telephone Encounter (Signed)
Patient requesting a refill on her "Ibuprofen 800 mg". Per patient  Dr Katrinka BlazingSmith has given it to her before to help with her severe pain with her menstrual cycle. [facility] [address]. Patient stated she only has 1 pill left and really needs this medicine called in as soon as possible. Patients call back number is [phone]

## 2014-09-30 NOTE — Telephone Encounter (Signed)
Called and informed pt rx is ready for pick up at pharmacy

## 2014-10-25 ENCOUNTER — Ambulatory Visit (INDEPENDENT_AMBULATORY_CARE_PROVIDER_SITE_OTHER): Payer: 59 | Admitting: Physician Assistant

## 2014-10-25 VITALS — BP 108/62 | HR 111 | Temp 99.0°F | Resp 20 | Ht 62.0 in | Wt 112.0 lb

## 2014-10-25 DIAGNOSIS — A499 Bacterial infection, unspecified: Secondary | ICD-10-CM

## 2014-10-25 DIAGNOSIS — R7989 Other specified abnormal findings of blood chemistry: Secondary | ICD-10-CM

## 2014-10-25 DIAGNOSIS — N898 Other specified noninflammatory disorders of vagina: Secondary | ICD-10-CM

## 2014-10-25 DIAGNOSIS — Z7251 High risk heterosexual behavior: Secondary | ICD-10-CM

## 2014-10-25 DIAGNOSIS — B9689 Other specified bacterial agents as the cause of diseases classified elsewhere: Secondary | ICD-10-CM

## 2014-10-25 DIAGNOSIS — E229 Hyperfunction of pituitary gland, unspecified: Secondary | ICD-10-CM

## 2014-10-25 DIAGNOSIS — N760 Acute vaginitis: Secondary | ICD-10-CM

## 2014-10-25 LAB — POCT WET PREP WITH KOH
Clue Cells Wet Prep HPF POC: 75
KOH Prep POC: NEGATIVE
Trichomonas, UA: NEGATIVE
YEAST WET PREP PER HPF POC: NEGATIVE

## 2014-10-25 LAB — POCT URINE PREGNANCY: Preg Test, Ur: POSITIVE

## 2014-10-25 MED ORDER — METRONIDAZOLE 500 MG PO TABS: 500.0000 mg | ORAL_TABLET | Freq: Two times a day (BID) | ORAL | Status: DC

## 2014-10-25 NOTE — Progress Notes (Signed)
Subjective:    Patient ID: Claudia Jackson, female    DOB: 07-30-1986, 28 y.o.   MRN: [id number]  PCP: Default, Provider, MD  Chief Complaint  Patient presents with  . Vaginal Discharge    x 3-4 days    No Known Allergies  Patient Active Problem List   Diagnosis Date Noted  . 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 Menorrhagia with regular cycle 05/02/2014  . Cervical dysplasia, mild 05/02/2014  . Iron deficiency anemia due to chronic blood loss 05/02/2014    Prior to Admission medications   Medication Sig Start Date End Date Taking? Authorizing Provider  norethindrone-ethinyl estradiol-iron (MICROGESTIN FE,GILDESS FE,LOESTRIN FE) 1.5-30 MG-MCG tablet Take 1 tablet by mouth daily. Patient not taking: Reported on 10/25/2014 07/19/14   Fernande Brashelle S Sephiroth Mcluckie, PA-C    Medical, Surgical, Family and Social History reviewed and updated.  HPI  Vaginal discharge with malodor x 3-4 days. No urinary symptoms. Breast tenderness, which is typical for her with menses.  Unprotected sex 11/28. Took Plan B that night. 3 days ago, did the 2-week follow-up UCG, and it was NEGATIVE. Repeated a UCG today, which was positive ("1-[redacted] weeks pregnant."). She did the test today "just because." Does not desire pregnancy now, and if pregnancy is confirms, she desires termination. LMP 09/25/2014.  Was told by the pharmacist that the antibiotics make birth control pills ineffective, so she stopped it 2 months ago when she started having dental issues and repeated courses of antibiotics. Took an antibiotic last week for a dental infection.   In addition, she is due for follow-up of a mildly elevated prolactin level.  Review of Systems As above.    Objective:   Physical Exam  Constitutional: She is oriented to person, place, and time. She appears well-developed and well-nourished. She is active and cooperative. No distress.  BP 108/62 mmHg  Pulse 111  Temp(Src) 99 F (37.2 C)  Resp 20  Ht 5\' 2"   (1.575 m)  Wt 112 lb (50.803 kg)  BMI 20.48 kg/m2  SpO2 95%  LMP 09/25/2014   Eyes: Conjunctivae are normal. No scleral icterus.  Pulmonary/Chest: Effort normal.  Abdominal: Hernia confirmed negative in the right inguinal area and confirmed negative in the left inguinal area.  Genitourinary: Pelvic exam was performed with patient supine. No labial fusion. There is no rash, tenderness, lesion or injury on the right labia. There is no rash, tenderness, lesion or injury on the left labia. Uterus is not deviated, not enlarged, not fixed and not tender. Cervix exhibits no motion tenderness, no discharge and no friability. Right adnexum displays no mass, no tenderness and no fullness. Left adnexum displays no mass, no tenderness and no fullness. No erythema, tenderness or bleeding in the vagina. No foreign body around the vagina. No signs of injury around the vagina. Vaginal discharge (thick, white; mild fishy odor) found.  Lymphadenopathy:       Right: No inguinal adenopathy present.       Left: No inguinal adenopathy present.  Neurological: She is alert and oriented to person, place, and time.  Psychiatric: She has a normal mood and affect. Her speech is normal and behavior is normal.   Results for orders placed or performed in visit on 10/25/14  POCT Wet Prep with KOH  Result Value Ref Range   Trichomonas, UA Negative    Clue Cells Wet Prep HPF POC 75%    Epithelial Wet Prep HPF POC 5-20    Yeast Wet Prep HPF  POC neg    Bacteria Wet Prep HPF POC 4+    RBC Wet Prep HPF POC 0-2    WBC Wet Prep HPF POC 1-2    KOH Prep POC Negative   POCT urine pregnancy  Result Value Ref Range   Preg Test, Ur Positive           Assessment & Plan:  1. Vaginal discharge - POCT Wet Prep with KOH - GC/Chlamydia Probe Amp  2. BV (bacterial vaginosis) - metroNIDAZOLE (FLAGYL) 500 MG tablet; Take 1 tablet (500 mg total) by mouth 2 (two) times daily with a meal. DO NOT CONSUME ALCOHOL WHILE TAKING THIS  MEDICATION.  Dispense: 14 tablet; Refill: 0  3. High risk sexual behavior Counseled on safer sex practices, including counseling on consistent condom and COC use.  It is unfortunate that she was counseled that the antibiotics she was given would render the COC ineffective and that she chose to stop them and not use condoms. Information about pregnancy resources and alternatives provided.  - POCT urine pregnancy - RPR - HSV(herpes simplex vrs) 1+2 ab-IgG - HIV antibody (with reflex) - Hepatitis C antibody  4. Elevated prolactin level Await results. - Prolactin   Fernande Brashelle S. Anhthu Perdew, PA-C Physician Assistant-Certified [facility]

## 2014-10-25 NOTE — Patient Instructions (Signed)
I will contact you with your lab results as soon as they are available.   If you have not heard from me in 2 weeks, please contact me.  The fastest way to get your results is to register for My Chart (see the instructions on the last page of this printout).   

## 2014-10-29 LAB — HIV ANTIBODY (ROUTINE TESTING W REFLEX): HIV-1/HIV-2 Ab: NONREACTIVE

## 2014-10-29 LAB — PROLACTIN: Prolactin: 30 ng/mL — ABNORMAL HIGH (ref 4.8–23.3)

## 2014-10-29 LAB — HSV(HERPES SIMPLEX VRS) I + II AB-IGG: HAV 1 IGG,TYPE SPECIFIC AB: 55.2 index — ABNORMAL HIGH (ref 0.00–0.90)

## 2014-10-29 LAB — HEPATITIS C ANTIBODY: Hep C Virus Ab: <0.1 s/co ratio (ref 0.0–0.9)

## 2014-10-29 LAB — RPR: RPR: NONREACTIVE

## 2014-10-30 ENCOUNTER — Telehealth: Payer: Self-pay

## 2014-10-30 ENCOUNTER — Encounter: Payer: Self-pay | Admitting: Physician Assistant

## 2014-10-30 DIAGNOSIS — B009 Herpesviral infection, unspecified: Secondary | ICD-10-CM | POA: Insufficient documentation

## 2014-10-30 NOTE — Telephone Encounter (Signed)
Excellent. It showed up as an overdue lab in my in basket.

## 2014-10-30 NOTE — Telephone Encounter (Signed)
-----   Message from Fernande Brashelle S Jeffery, [place] sent at 10/30/2014  6:03 PM EST ----- Regarding: overdue results Please check on the status of the GC/CT. Thanks!  ----- Message -----    From: SYSTEM    Sent: 10/30/2014  12:05 AM      To: Fernande Brashelle S Jeffery, PA-C

## 2014-10-30 NOTE — Telephone Encounter (Signed)
Called [facility]. They received the specimen on 10/28/14 and there is a 4 day turnaround. But they do have it and are working on it

## 2014-10-31 LAB — GC/CHLAMYDIA PROBE AMP
CHLAMYDIA, DNA PROBE: NEGATIVE
Neisseria gonorrhoeae by PCR: NEGATIVE

## 2015-04-17 ENCOUNTER — Telehealth: Payer: Self-pay

## 2015-04-17 DIAGNOSIS — Z3009 Encounter for other general counseling and advice on contraception: Secondary | ICD-10-CM

## 2015-04-17 NOTE — Telephone Encounter (Signed)
Patient is calling because she would like her birth control to be changed back to the nuva ring. Please call patient! [phone]

## 2015-04-18 MED ORDER — ETONOGESTREL-ETHINYL ESTRADIOL 0.12-0.015 MG/24HR VA RING: VAGINAL_RING | VAGINAL | Status: DC

## 2015-04-18 NOTE — Telephone Encounter (Signed)
Pt took plan B pill on 04/12/15, the same day she had unprotected sex. She was not take her OCPs at that time. She restarted OCPs 5/29 that were prescribed to her 8 months ago. She started feeling nauseated. She remembers this happening to her several years ago when she was on OCPs. She stopped the pills yesterday and states she "instantly felt better". She has been on nuva ring in the past and worked well. She wants to restart nuvaring. Rx sent to the pharmacy. She will take a home pregnancy test in 2 weeks.

## 2015-04-28 ENCOUNTER — Ambulatory Visit (INDEPENDENT_AMBULATORY_CARE_PROVIDER_SITE_OTHER): Payer: 59 | Admitting: Family Medicine

## 2015-04-28 ENCOUNTER — Other Ambulatory Visit: Payer: Self-pay | Admitting: Family Medicine

## 2015-04-28 VITALS — BP 100/68 | HR 74 | Temp 98.7°F | Resp 16 | Ht 63.0 in | Wt 110.0 lb

## 2015-04-28 DIAGNOSIS — R531 Weakness: Secondary | ICD-10-CM

## 2015-04-28 DIAGNOSIS — D539 Nutritional anemia, unspecified: Secondary | ICD-10-CM

## 2015-04-28 DIAGNOSIS — Z113 Encounter for screening for infections with a predominantly sexual mode of transmission: Secondary | ICD-10-CM | POA: Diagnosis not present

## 2015-04-28 LAB — POCT CBC
Granulocyte percent: 47 %G (ref 37–80)
HEMATOCRIT: 37.5 % — AB (ref 37.7–47.9)
Hemoglobin: 11.2 g/dL — AB (ref 12.2–16.2)
Lymph, poc: 4.5 — AB (ref 0.6–3.4)
MCH: 23.6 pg — AB (ref 27–31.2)
MCHC: 30 g/dL — AB (ref 31.8–35.4)
MCV: 78.7 fL — AB (ref 80–97)
MID (CBC): 0.7 (ref 0–0.9)
MPV: 7.4 fL (ref 0–99.8)
POC GRANULOCYTE: 4.7 (ref 2–6.9)
POC LYMPH PERCENT: 45.9 %L (ref 10–50)
POC MID %: 7.1 %M (ref 0–12)
Platelet Count, POC: 558 10*3/uL — AB (ref 142–424)
RBC: 4.76 M/uL (ref 4.04–5.48)
RDW, POC: 18.4 %
WBC: 9.9 10*3/uL (ref 4.6–10.2)

## 2015-04-28 LAB — POCT URINE PREGNANCY: PREG TEST UR: NEGATIVE

## 2015-04-28 LAB — GLUCOSE, POCT (MANUAL RESULT ENTRY): POC GLUCOSE: 88 mg/dL (ref 70–99)

## 2015-04-28 NOTE — Patient Instructions (Signed)
I suspect that your symptoms are due to low blood pressure and low blood sugar.   Try to eat frequent snacks with carbs as well as fat and protein (such as cheese and crackers, apple and PB, [place] sandwich with cheese, etc). Also, work on increasing your fluid intake- especially gatorade I will be in touch with the rest of your labs asap- it does look like you are a bit anemic and we may need to start you on an iron pill again.

## 2015-04-28 NOTE — Progress Notes (Addendum)
[facility] [address] [place] [address] [id number] [phone]  Date:  04/28/2015   Name:  Claudia Jackson   DOB:  08/31/1986   MRN:  [id number]  PCP:  Default, Provider, MD    Chief Complaint: Fatigue and Anemia   History of Present Illness:  Claudia Jackson is a 29 y.o. very pleasant female patient who presents with the following:  History of menorrhagia and iron def anemia in the past.   She feels like she has the same sx of iron def anemia that she had before- feeing weak and tired. She was treated with iron and improved.  No longer taking iron     About 2 weeks ago she took plan B after unprotected sex.  She then went on OCP and had sx of nausea- she had the same trouble with OCP in the past.  She was changed to nuvaring and started this about one week ago.  She thinks that this is working better for her and the nausea is resolved.  She has taken a couple of other home HCG tests which were negative- she feel fairly certain that she is not pregnant but she would like to check one more time.   Yesterday she was out shopping and felt ill, shaky and hot like her blood sugar was low.  She states that she is still feeling this way now but not as severely   She does not have any pain in her body. Nausea is all resolved at this time  Admits she has not eaten much today.  She ate a little breakfast but started to feel sick to her stomach so she stopped eating She denies any SOB- states that she did get out of breath walking to her car over the weekend but attributed it to very hot weather  BP Readings from Last 3 Encounters:  04/28/15 100/68  10/25/14 108/62  07/19/14 102/60   Lying 100/67, P 83 Sitting 113/62, P 96 109/72, P 91  She tends to have low BP and to be quite thin.   She has a 66 year old daughter.   She feels like she has had more issues with hypotension/ low glucose sx since she turned 29 years old  Patient Active Problem List   Diagnosis  Date Noted  . 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 Menorrhagia with regular cycle 05/02/2014  . Cervical dysplasia, mild 05/02/2014  . Iron deficiency anemia due to chronic blood loss 05/02/2014    Past Medical History  Diagnosis Date  . Allergy   . Anemia     History reviewed. No pertinent past surgical history.  History  Substance Use Topics  . Smoking status: Never Smoker   . Smokeless tobacco: Never Used  . Alcohol Use: 0.0 - 1.0 oz/week    0-2 drink(s) per week    Family History  Problem Relation Age of Onset  . Cancer Brother     Hodgkin;s Lymphoma  . Cancer Paternal Aunt     BRAIN  . Breast cancer Maternal Grandmother   . Cancer Maternal Grandmother     BRAIN  . Asthma Son     No Known Allergies  Medication list has been reviewed and updated.  Current Outpatient Prescriptions on File Prior to Visit  Medication Sig Dispense Refill  . etonogestrel-ethinyl estradiol (NUVARING) 0.12-0.015 MG/24HR vaginal ring Insert vaginally and leave in place for 3 consecutive weeks, then remove for 1  week. 1 each 12   No current facility-administered medications on file prior to visit.    Review of Systems:  As per HPI- otherwise negative.   Physical Examination: Filed Vitals:   04/28/15 1324  BP: 100/68  Pulse: 74  Temp: 98.7 F (37.1 C)  Resp: 16   Filed Vitals:   04/28/15 1324  Height: 5\' 3"  (1.6 m)  Weight: 110 lb (49.896 kg)   Body mass index is 19.49 kg/(m^2). Ideal Body Weight: Weight in (lb) to have BMI = 25: 140.8  GEN: WDWN, NAD, Non-toxic, A & O x 3, slim build, looks well HEENT: Atraumatic, Normocephalic. Neck supple. No masses, No LAD.  Bilateral TM wnl, oropharynx normal.  PEERL,EOMI.   Ears and Nose: No external deformity. CV: RRR, No M/G/R. No JVD. No thrill. No extra heart sounds. PULM: CTA B, no wheezes, crackles, rhonchi. No retractions. No resp. distress. No accessory muscle use. ABD: S, NT, ND, +BS. No  rebound. No HSM. EXTR: No c/c/e NEURO Normal gait.  PSYCH: Normally interactive. Conversant. Not depressed or anxious appearing.  Calm demeanor.   Tried again to read her O2 saturation.  She is wearing dark metallic gel fingernails and the same polish on her toes. Could not get meter to read at all. Do not believe that 90% saturation recorded at triage is accurate.  We do not have any acetone to remove polish  Results for orders placed or performed in visit on 04/28/15  POCT CBC  Result Value Ref Range   WBC 9.9 4.6 - 10.2 K/uL   Lymph, poc 4.5 (A) 0.6 - 3.4   POC LYMPH PERCENT 45.9 10 - 50 %L   MID (cbc) 0.7 0 - 0.9   POC MID % 7.1 0 - 12 %M   POC Granulocyte 4.7 2 - 6.9   Granulocyte percent 47.0 37 - 80 %G   RBC 4.76 4.04 - 5.48 M/uL   Hemoglobin 11.2 (A) 12.2 - 16.2 g/dL   HCT, POC 63.8 (A) 45.3 - 47.9 %   MCV 78.7 (A) 80 - 97 fL   MCH, POC 23.6 (A) 27 - 31.2 pg   MCHC 30.0 (A) 31.8 - 35.4 g/dL   RDW, POC 64.6 %   Platelet Count, POC 558 (A) 142 - 424 K/uL   MPV 7.4 0 - 99.8 fL  POCT urine pregnancy  Result Value Ref Range   Preg Test, Ur Negative Negative  POCT glucose (manual entry)  Result Value Ref Range   POC Glucose 88 70 - 99 mg/dl   Assessment and Plan: Weakness - Plan: POCT CBC, POCT urine pregnancy, POCT glucose (manual entry), Comprehensive metabolic panel, TSH, CANCELED: Comprehensive metabolic panel, CANCELED: TSH  Routine screening for STI (sexually transmitted infection) - Plan: GC/Chlamydia Probe Amp, Hepatitis C Antibody, RPR, HIV antibody, CANCELED: HIV antibody, CANCELED: RPR, CANCELED: Hepatitis C antibody, CANCELED: GC/Chlamydia Probe Amp  Deficiency anemia - Plan: Ferritin  Malaise and feeling of hypoglycemia.  History is somewhat vague but it seems she has had these sx off an on for a year or more, but worse over the weekend.   Also history of anemia; it does appear that she is iron deficient again today with low MCV.   Encouraged hydration and  frequent meals/ snacks to maintain her pressure and blood glucose.  She requests a full STI panel- done today Await ferritin to get a better idea of her iron status Will check in with her once labs come in  Signed Abbe Amsterdam, MD  Called to check on her with labs-LMOM that labs are ok except for mild renal abnl, likely due to dehydration Results for orders placed or performed in visit on 04/28/15  Hepatitis C Antibody  Result Value Ref Range   Hep C Virus Ab 0.2 0.0 - 0.9 s/co ratio  RPR  Result Value Ref Range   RPR Ser Ql Non Reactive Non Reactive  HIV antibody  Result Value Ref Range   HIV Screen 4th Generation wRfx Non Reactive Non Reactive  Comprehensive metabolic panel  Result Value Ref Range   Glucose 102 (H) 65 - 99 mg/dL   BUN 26 (H) 6 - 20 mg/dL   Creatinine, Ser 1.61 (H) 0.57 - 1.00 mg/dL   GFR calc non Af Amer 60 >59 mL/min/1.73   GFR calc Af Amer 70 >59 mL/min/1.73   BUN/Creatinine Ratio 22 (H) 8 - 20   Sodium 140 134 - 144 mmol/L   Potassium 4.8 3.5 - 5.2 mmol/L   Chloride 100 97 - 108 mmol/L   CO2 23 18 - 29 mmol/L   Calcium 9.5 8.7 - 10.2 mg/dL   Total Protein 6.7 6.0 - 8.5 g/dL   Albumin 4.5 3.5 - 5.5 g/dL   Globulin, Total 2.2 1.5 - 4.5 g/dL   Albumin/Globulin Ratio 2.0 1.1 - 2.5   Bilirubin Total <0.2 0.0 - 1.2 mg/dL   Alkaline Phosphatase 87 39 - 117 IU/L   AST 18 0 - 40 IU/L   ALT 24 0 - 32 IU/L  TSH  Result Value Ref Range   TSH 0.822 0.450 - 4.500 uIU/mL  POCT CBC  Result Value Ref Range   WBC 9.9 4.6 - 10.2 K/uL   Lymph, poc 4.5 (A) 0.6 - 3.4   POC LYMPH PERCENT 45.9 10 - 50 %L   MID (cbc) 0.7 0 - 0.9   POC MID % 7.1 0 - 12 %M   POC Granulocyte 4.7 2 - 6.9   Granulocyte percent 47.0 37 - 80 %G   RBC 4.76 4.04 - 5.48 M/uL   Hemoglobin 11.2 (A) 12.2 - 16.2 g/dL   HCT, POC 09.6 (A) 04.5 - 47.9 %   MCV 78.7 (A) 80 - 97 fL   MCH, POC 23.6 (A) 27 - 31.2 pg   MCHC 30.0 (A) 31.8 - 35.4 g/dL   RDW, POC 40.9 %   Platelet Count, POC 558  (A) 142 - 424 K/uL   MPV 7.4 0 - 99.8 fL  POCT urine pregnancy  Result Value Ref Range   Preg Test, Ur Negative Negative  POCT glucose (manual entry)  Result Value Ref Range   POC Glucose 88 70 - 99 mg/dl   Called her to discuss 6/15- ferritin is normal.   She reports that she still feels weak and tired. Encouraged her to come back in, if she is still hypotensive perhaps we can give her some IV fluids. Her renal function is a little bit high.   Also, asked her to please try and get her gel nails off so we can get an accurate O2 sat measurement

## 2015-04-29 LAB — COMPREHENSIVE METABOLIC PANEL
ALT: 24 IU/L (ref 0–32)
AST: 18 IU/L (ref 0–40)
Albumin/Globulin Ratio: 2 (ref 1.1–2.5)
Albumin: 4.5 g/dL (ref 3.5–5.5)
Alkaline Phosphatase: 87 IU/L (ref 39–117)
BUN/Creatinine Ratio: 22 — ABNORMAL HIGH (ref 8–20)
BUN: 26 mg/dL — ABNORMAL HIGH (ref 6–20)
CO2: 23 mmol/L (ref 18–29)
Calcium: 9.5 mg/dL (ref 8.7–10.2)
Chloride: 100 mmol/L (ref 97–108)
Creatinine, Ser: 1.2 mg/dL — ABNORMAL HIGH (ref 0.57–1.00)
GFR calc non Af Amer: 60 mL/min/{1.73_m2} (ref >59)
GFR, EST AFRICAN AMERICAN: 70 mL/min/{1.73_m2} (ref >59)
GLUCOSE: 102 mg/dL — AB (ref 65–99)
Globulin, Total: 2.2 g/dL (ref 1.5–4.5)
POTASSIUM: 4.8 mmol/L (ref 3.5–5.2)
Sodium: 140 mmol/L (ref 134–144)
TOTAL PROTEIN: 6.7 g/dL (ref 6.0–8.5)

## 2015-04-29 LAB — RPR: RPR: NONREACTIVE

## 2015-04-29 LAB — HEPATITIS C ANTIBODY: HEP C VIRUS AB: 0.2 {s_co_ratio} (ref 0.0–0.9)

## 2015-04-29 LAB — HIV ANTIBODY (ROUTINE TESTING W REFLEX): HIV Screen 4th Generation wRfx: NONREACTIVE

## 2015-04-29 LAB — TSH: TSH: 0.822 u[IU]/mL (ref 0.450–4.500)

## 2015-04-30 ENCOUNTER — Encounter: Payer: Self-pay | Admitting: Family Medicine

## 2015-04-30 LAB — FERRITIN: FERRITIN: 81 ng/mL (ref 15–150)

## 2015-04-30 LAB — GC/CHLAMYDIA PROBE AMP
CHLAMYDIA, DNA PROBE: NEGATIVE
Neisseria gonorrhoeae by PCR: NEGATIVE

## 2015-05-11 ENCOUNTER — Encounter: Payer: Self-pay | Admitting: Family Medicine

## 2016-02-25 ENCOUNTER — Ambulatory Visit (INDEPENDENT_AMBULATORY_CARE_PROVIDER_SITE_OTHER): Payer: 59 | Admitting: Physician Assistant

## 2016-02-25 VITALS — BP 110/70 | HR 82 | Temp 98.2°F | Resp 18 | Ht 63.0 in | Wt 115.6 lb

## 2016-02-25 DIAGNOSIS — N870 Mild cervical dysplasia: Secondary | ICD-10-CM

## 2016-02-25 DIAGNOSIS — D500 Iron deficiency anemia secondary to blood loss (chronic): Secondary | ICD-10-CM | POA: Diagnosis not present

## 2016-02-25 DIAGNOSIS — Z124 Encounter for screening for malignant neoplasm of cervix: Secondary | ICD-10-CM | POA: Diagnosis not present

## 2016-02-25 DIAGNOSIS — Z0000 Encounter for general adult medical examination without abnormal findings: Secondary | ICD-10-CM

## 2016-02-25 DIAGNOSIS — Z113 Encounter for screening for infections with a predominantly sexual mode of transmission: Secondary | ICD-10-CM | POA: Diagnosis not present

## 2016-02-25 DIAGNOSIS — N920 Excessive and frequent menstruation with regular cycle: Secondary | ICD-10-CM | POA: Diagnosis not present

## 2016-02-25 MED ORDER — IBUPROFEN 800 MG PO TABS: 800.0000 mg | ORAL_TABLET | Freq: Three times a day (TID) | ORAL | Status: DC | PRN

## 2016-02-25 MED ORDER — MEDROXYPROGESTERONE ACETATE 150 MG/ML IM SUSY
150.0000 mg | PREFILLED_SYRINGE | INTRAMUSCULAR | Status: AC
  Administered 2016-02-25: 150 mg via INTRAMUSCULAR

## 2016-02-25 NOTE — Progress Notes (Signed)
Patient ID: Claudia Jackson, female    DOB: 04-Oct-1986, 30 y.o.   MRN: [id number]  PCP: Default, Provider, MD  Chief Complaint  Patient presents with  . Annual Exam    CPE    Subjective:   HPI: Presents for annual exam.  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 Mild dysplasia on colposcopy. New sexual partner. Desires resumption of contraception and repeat STI screening.   Patient Active Problem List   Diagnosis Date Noted  . 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 Menorrhagia with regular cycle 05/02/2014  . Cervical dysplasia, mild 05/02/2014  . Iron deficiency anemia due to chronic blood loss 05/02/2014    Past Medical History  Diagnosis Date  . Allergy   . Anemia      Prior to Admission medications   Not on File    No Known Allergies  History reviewed. No pertinent past surgical history.  Family History  Problem Relation Age of Onset  . Cancer Brother 13    Hodgkin's Lymphoma in remission  . Cancer Paternal Aunt     BRAIN  . Breast cancer Maternal Grandmother   . Cancer Maternal Grandmother     BRAIN  . Asthma Son     Social History   Social History  . Marital Status: Single    Spouse Name: n/a  . Number of Children: 1  . Years of Education: 12+   Occupational History  . Medical Billing     [employer]   Social History Main Topics  . Smoking status: Never Smoker   . Smokeless tobacco: Never Used  . Alcohol Use: 0.0 - 1.0 oz/week    0-2 Standard drinks or equivalent per week  . Drug Use: No  . Sexual Activity:    Partners: Male    Birth Control/ Protection: Condom     Comment: inconsistent condom use; NuvaRing caused abdominal pain   Other Topics Concern  . None   Social History Narrative   Marital status: single; dating casually.      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      Tobacco: none      Alcohol: socially; no DWIs.      Drugs: none   Exercise: none      Seatbelt: 100%      Guns: none      Sexually:  Total partners = 4; no STDs.  Condoms 90%       Review of Systems      Objective:  Physical Exam  Constitutional: She is oriented to person, place, and time. Vital signs are normal. She appears well-developed and well-nourished. She is active and cooperative. No distress.  BP 110/70 mmHg  Pulse 82  Temp(Src) 98.2 F (36.8 C) (Oral)  Resp 18  Ht 5\' 3"  (1.6 m)  Wt 115 lb 9.6 oz (52.436 kg)  BMI 20.48 kg/m2  SpO2 98%  LMP 02/25/2016 (Exact Date)   HENT:  Head: Normocephalic and atraumatic.  Right Ear: Hearing, tympanic membrane, external ear and ear canal normal. No foreign bodies.  Left Ear: Hearing, tympanic membrane, external ear and ear canal normal. No foreign bodies.  Nose: Nose normal.  Mouth/Throat: Uvula is midline, oropharynx is clear and moist and mucous membranes are normal. No oral lesions. Normal dentition. No dental abscesses or uvula swelling. No oropharyngeal exudate.  Eyes: Conjunctivae, EOM and lids are normal.  Pupils are equal, round, and reactive to light. Right eye exhibits no discharge. Left eye exhibits no discharge. No scleral icterus.  Fundoscopic exam:      The right eye shows no arteriolar narrowing, no AV nicking, no exudate, no hemorrhage and no papilledema.       The left eye shows no arteriolar narrowing, no AV nicking, no exudate, no hemorrhage and no papilledema.  Neck: Trachea normal, normal range of motion and full passive range of motion without pain. Neck supple. No spinous process tenderness and no muscular tenderness present. No thyroid mass and no thyromegaly present.  Cardiovascular: Normal rate, regular rhythm, normal heart sounds, intact distal pulses and normal pulses.   Pulmonary/Chest: Effort normal and breath sounds normal. She exhibits no tenderness and no retraction. Right breast exhibits no inverted nipple, no mass, no nipple discharge, no skin change and no  tenderness. Left breast exhibits no inverted nipple, no mass, no nipple discharge, no skin change and no tenderness. Breasts are symmetrical.  Abdominal: Soft. Normal appearance and bowel sounds are normal. She exhibits no distension and no mass. There is no hepatosplenomegaly. There is no tenderness. There is no rigidity, no rebound, no guarding, no CVA tenderness, no tenderness at McBurney's point and negative Murphy's sign. No hernia. Hernia confirmed negative in the right inguinal area and confirmed negative in the left inguinal area.  Genitourinary: Uterus normal. No breast swelling, tenderness, discharge or bleeding. Pelvic exam was performed with patient supine. No labial fusion. There is no rash, tenderness, lesion or injury on the right labia. There is no rash, tenderness, lesion or injury on the left labia. Cervix exhibits no motion tenderness, no discharge and no friability. Right adnexum displays no mass, no tenderness and no fullness. Left adnexum displays no mass, no tenderness and no fullness. There is bleeding in the vagina. No erythema or tenderness in the vagina. No foreign body around the vagina. No signs of injury around the vagina. No vaginal discharge found.  Musculoskeletal: She exhibits no edema or tenderness.       Cervical back: Normal.       Thoracic back: Normal.       Lumbar back: Normal.  Lymphadenopathy:       Head (right side): No tonsillar, no preauricular, no posterior auricular and no occipital adenopathy present.       Head (left side): No tonsillar, no preauricular, no posterior auricular and no occipital adenopathy present.    She has no cervical adenopathy.    She has no axillary adenopathy.       Right: No inguinal and no supraclavicular adenopathy present.       Left: No inguinal and no supraclavicular adenopathy present.  Neurological: She is alert and oriented to person, place, and time. She has normal strength and normal reflexes. No cranial nerve deficit.  She exhibits normal muscle tone. Coordination and gait normal.  Skin: Skin is warm, dry and intact. No rash noted. She is not diaphoretic. No cyanosis or erythema. Nails show no clubbing.  Psychiatric: She has a normal mood and affect. Her speech is normal and behavior is normal. Judgment and thought content normal.           Assessment & Plan:  1. Annual physical exam Age appropriate anticipatory guidance provided.  2. Menorrhagia with regular cycle Resume Depo-Provera. - Comprehensive metabolic panel - MedroxyPROGESTERone Acetate SUSY 150 mg; Inject 1 mL (150 mg total) into the muscle every 3 (three) months.  3. Iron deficiency anemia  due to chronic blood loss Likely due to menorrhagia. Hgb electrophoresis intended. Unfortunately, serum protein electrophoresis ordered instead. Will add correct test. - CBC with Differential/Platelet - Serum protein electrophoresis with reflex  4. Cervical dysplasia, mild If cytology normal and HPV negative, repeat both in 5 years. - Pap IG, CT/NG NAA, and HPV (high risk)  5. Routine screening for STI (sexually transmitted infection) Await lab results. Counseled on safer sex practices. - HIV antibody - RPR - Pap IG, CT/NG NAA, and HPV (high risk)   Fernande Bras, PA-C Physician Assistant-Certified [facility]

## 2016-02-25 NOTE — Patient Instructions (Addendum)
IF you received an x-ray today, you will receive an invoice from [facility]. Please contact [facility] at [phone] with questions or concerns regarding your invoice.   IF you received labwork today, you will receive an invoice from [employer]. Please contact [facility] at [phone] with questions or concerns regarding your invoice. (Your labs were sent to [facility])  Our billing staff will not be able to assist you with questions regarding bills from these companies.  You will be contacted with the lab results as soon as they are available. The fastest way to get your results is to activate your My Chart account. Instructions are located on the last page of this paperwork. If you have not heard from [place] regarding the results in 2 weeks, please contact this office.     Keeping You Healthy  Get These Tests 1. Blood Pressure- Have your blood pressure checked once a year by your health care provider.  Normal blood pressure is 120/80. 2. Weight- Have your body mass index (BMI) calculated to screen for obesity.  BMI is measure of body fat based on height and weight.  You can also calculate your own BMI at https://www.west-esparza.com/. 3. Cholesterol- Have your cholesterol checked every 5 years starting at age 14 then yearly starting at age 23. 4. Chlamydia, HIV, and other sexually transmitted diseases- Get screened every year until age 54, then within three months of each new sexual provider. 5. Pap Test - Every 1-5 years; discuss with your health care provider. 6. Mammogram- Every 1-2 years starting at age 64--50  Take these medicines  Calcium with Vitamin D-Your body needs 1200 mg of Calcium each day and [phone] IU of Vitamin D daily.  Your body can only absorb 500 mg of Calcium at a time so Calcium must be taken in 2 or 3 divided doses throughout the day.  Multivitamin with folic acid- Once daily if it is possible for you to become  pregnant.  Get these Immunizations  Gardasil-Series of three doses; prevents HPV related illness such as genital warts and cervical cancer.  Menactra-Single dose; prevents meningitis.  Tetanus shot- Every 10 years.  Flu shot-Every year.  Take these steps 1. Do not smoke-Your healthcare provider can help you quit.  For tips on how to quit go to www.smokefree.gov or call 1-800 QUITNOW. 2. Be physically active- Exercise 5 days a week for at least 30 minutes.  If you are not already physically active, start slow and gradually work up to 30 minutes of moderate physical activity.  Examples of moderate activity include walking briskly, dancing, swimming, bicycling, etc. 3. Breast Cancer- A self breast exam every month is important for early detection of breast cancer.  For more information and instruction on self breast exams, ask your healthcare provider or [email or web address]. 4. Eat a healthy diet- Eat a variety of healthy foods such as fruits, vegetables, whole grains, low fat milk, low fat cheeses, yogurt, lean meats, poultry and fish, beans, nuts, tofu, etc.  For more information go to www. Thenutritionsource.org 5. Drink alcohol in moderation- Limit alcohol intake to one drink or less per day. Never drink and drive. 6. Depression- Your emotional health is as important as your physical health.  If you're feeling down or losing interest in things you normally enjoy please talk to your healthcare provider about being screened for depression. 7. Dental visit- Brush and floss your teeth twice daily; visit your dentist twice a year. 8. Eye doctor- Get an  eye exam at least every 2 years. 9. Helmet use- Always wear a helmet when riding a bicycle, motorcycle, rollerblading or skateboarding. 10. Safe sex- If you may be exposed to sexually transmitted infections, use a condom. 11. Seat belts- Seat belts can save your live; always wear one. 12. Smoke/Carbon Monoxide detectors-  These detectors need to be installed on the appropriate level of your home. Replace batteries at least once a year. 13. Skin cancer- When out in the sun please cover up and use sunscreen 15 SPF or higher. 14. Violence- If anyone is threatening or hurting you, please tell your healthcare provider.

## 2016-02-28 ENCOUNTER — Telehealth: Payer: Self-pay

## 2016-02-28 NOTE — Telephone Encounter (Signed)
Spoke with patient informed her if her stomach pain gets worse she is to go to emergency room.  Patient stated she is afraid she is having a reaction to her Depo shot.  The patient states it has gotten better with ibuprofen.  She will come in Monday if it does not get better.

## 2016-02-28 NOTE — Telephone Encounter (Signed)
Patient came in Thursday 02/26/16 and got the depo shot for the first time and she started having really bad pains in her lower stomach around her bladder area, she isnt sure if this is a side effect from the shot or something else but she states that is the only thing that has changed in her everyday life. She states she is in a lot of pain and would like someone to call her back ASAP and let her know if she needs to come in or something else.   Patients call back number is [phone].

## 2016-03-01 ENCOUNTER — Telehealth: Payer: Self-pay

## 2016-03-01 LAB — COMPREHENSIVE METABOLIC PANEL
ALBUMIN: 4.4 g/dL (ref 3.5–5.5)
ALT: 15 IU/L (ref 0–32)
AST: 19 IU/L (ref 0–40)
Albumin/Globulin Ratio: 1.4 (ref 1.2–2.2)
Alkaline Phosphatase: 70 IU/L (ref 39–117)
BUN/Creatinine Ratio: 16 (ref 9–23)
BUN: 12 mg/dL (ref 6–20)
Bilirubin Total: 0.2 mg/dL (ref 0.0–1.2)
CO2: 21 mmol/L (ref 18–29)
CREATININE: 0.77 mg/dL (ref 0.57–1.00)
Calcium: 8.9 mg/dL (ref 8.7–10.2)
Chloride: 103 mmol/L (ref 96–106)
GFR calc non Af Amer: 103 mL/min/{1.73_m2} (ref >59)
GFR, EST AFRICAN AMERICAN: 119 mL/min/{1.73_m2} (ref >59)
Globulin, Total: 3.2 g/dL (ref 1.5–4.5)
Glucose: 87 mg/dL (ref 65–99)
Potassium: 4.5 mmol/L (ref 3.5–5.2)
Sodium: 140 mmol/L (ref 134–144)
TOTAL PROTEIN: 7.6 g/dL (ref 6.0–8.5)

## 2016-03-01 LAB — RPR: RPR Ser Ql: NONREACTIVE

## 2016-03-01 LAB — PROTEIN ELECTROPHORESIS, SERUM, WITH REFLEX
A/G Ratio: 1.1 (ref 0.7–1.7)
Albumin ELP: 3.9 g/dL (ref 2.9–4.4)
Alpha 1: 0.2 g/dL (ref 0.0–0.4)
Alpha 2: 0.7 g/dL (ref 0.4–1.0)
Beta: 1.3 g/dL (ref 0.7–1.3)
GLOBULIN, TOTAL: 3.7 g/dL (ref 2.2–3.9)
Gamma Globulin: 1.6 g/dL (ref 0.4–1.8)

## 2016-03-01 LAB — CBC WITH DIFFERENTIAL/PLATELET
BASOS: 2 %
Basophils Absolute: 0.1 10*3/uL (ref 0.0–0.2)
EOS (ABSOLUTE): 0.6 10*3/uL — AB (ref 0.0–0.4)
EOS: 8 %
HEMATOCRIT: 31.8 % — AB (ref 34.0–46.6)
Hemoglobin: 9.7 g/dL — ABNORMAL LOW (ref 11.1–15.9)
IMMATURE GRANS (ABS): 0 10*3/uL (ref 0.0–0.1)
Immature Granulocytes: 0 %
Lymphocytes Absolute: 3 10*3/uL (ref 0.7–3.1)
Lymphs: 40 %
MCH: 23.1 pg — AB (ref 26.6–33.0)
MCHC: 30.5 g/dL — ABNORMAL LOW (ref 31.5–35.7)
MCV: 76 fL — ABNORMAL LOW (ref 79–97)
MONOS ABS: 0.6 10*3/uL (ref 0.1–0.9)
Monocytes: 8 %
NEUTROS ABS: 3.1 10*3/uL (ref 1.4–7.0)
Neutrophils: 42 %
PLATELETS: 558 10*3/uL — AB (ref 150–379)
RBC: 4.2 x10E6/uL (ref 3.77–5.28)
RDW: 16.3 % — ABNORMAL HIGH (ref 12.3–15.4)
WBC: 7.4 10*3/uL (ref 3.4–10.8)

## 2016-03-01 LAB — HIV ANTIBODY (ROUTINE TESTING W REFLEX): HIV Screen 4th Generation wRfx: NONREACTIVE

## 2016-03-01 NOTE — Telephone Encounter (Signed)
Patient called in today stating that she is feeling a lot better and doesn't feel like she needs to come in (please see telephone message from 02/28/16) she would like to get the results of her lab testing that she had done on 02/26/16. She states she normally sees everything through mychart but hadn't gotten anything as of yet and she is alittle concerned. Can someone make sure she gets these results please and thank you!

## 2016-03-01 NOTE — Telephone Encounter (Signed)
Her labs were ordered through [facility]

## 2016-03-02 ENCOUNTER — Encounter: Payer: Self-pay | Admitting: Physician Assistant

## 2016-03-02 NOTE — Telephone Encounter (Signed)
I was waiting to release them all when I got the pap test result.  I will release what I have now.

## 2016-03-04 LAB — PAP IG, CT-NG NAA, HPV HIGH-RISK
Chlamydia, Nuc. Acid Amp: NEGATIVE
Gonococcus by Nucleic Acid Amp: NEGATIVE
HPV, high-risk: POSITIVE — AB
PAP SMEAR COMMENT: 0

## 2016-03-10 ENCOUNTER — Encounter: Payer: Self-pay | Admitting: Physician Assistant

## 2016-04-20 ENCOUNTER — Encounter: Payer: Self-pay | Admitting: Physician Assistant

## 2016-05-12 ENCOUNTER — Ambulatory Visit (INDEPENDENT_AMBULATORY_CARE_PROVIDER_SITE_OTHER): Payer: 59 | Admitting: Family Medicine

## 2016-05-12 VITALS — BP 118/76 | HR 77 | Temp 98.5°F | Resp 18 | Ht 63.0 in | Wt 115.0 lb

## 2016-05-12 DIAGNOSIS — B373 Candidiasis of vulva and vagina: Secondary | ICD-10-CM | POA: Diagnosis not present

## 2016-05-12 DIAGNOSIS — N898 Other specified noninflammatory disorders of vagina: Secondary | ICD-10-CM | POA: Diagnosis not present

## 2016-05-12 DIAGNOSIS — B3731 Acute candidiasis of vulva and vagina: Secondary | ICD-10-CM

## 2016-05-12 LAB — POCT WET + KOH PREP: TRICH BY WET PREP: ABSENT

## 2016-05-12 MED ORDER — FLUCONAZOLE 150 MG PO TABS: 150.0000 mg | ORAL_TABLET | Freq: Once | ORAL | Status: DC

## 2016-05-12 NOTE — Progress Notes (Signed)
Subjective:    Patient ID: Claudia Jackson, female    DOB: [birth year], 30 y.o.   MRN: [id number]  05/12/2016  Vaginal Discharge   HPI This 30 y.o. female presents for evaluation of vaginal discharge.  Onset two or three weeks ago.  On DepoProvera shot; menses sporadic.  Had bleeding with menses; used different pad; had diaper rash with new pad use. Was peeling in vaginal area. Then when that recovered/improved, developed excessive itching in vaginal area with white discharge.  Researched on line; no medication. Not sexual activity; last sexual activity since March 2017.  No STDs. Just had STD screening completed.   Review of Systems  Constitutional: Negative for fever, chills, diaphoresis and fatigue.  Eyes: Negative for visual disturbance.  Respiratory: Negative for cough and shortness of breath.   Cardiovascular: Negative for chest pain, palpitations and leg swelling.  Gastrointestinal: Negative for nausea, vomiting, abdominal pain, diarrhea and constipation.  Endocrine: Negative for cold intolerance, heat intolerance, polydipsia, polyphagia and polyuria.  Genitourinary: Positive for vaginal discharge. Negative for urgency, frequency, hematuria, genital sores, vaginal pain, pelvic pain and dyspareunia.  Neurological: Negative for dizziness, tremors, seizures, syncope, facial asymmetry, speech difficulty, weakness, light-headedness, numbness and headaches.    Past Medical History  Diagnosis Date  . Allergy   . Anemia    History reviewed. No pertinent past surgical history. No Known Allergies Current Outpatient Prescriptions  Medication Sig Dispense Refill  . ibuprofen (ADVIL,MOTRIN) 800 MG tablet Take 1 tablet (800 mg total) by mouth every 8 (eight) hours as needed. 90 tablet 0  . fluconazole (DIFLUCAN) 150 MG tablet Take 1 tablet (150 mg total) by mouth once. Repeat if needed 2 tablet 0   Current Facility-Administered Medications  Medication Dose Route Frequency Provider  Last Rate Last Dose  . MedroxyPROGESTERone Acetate SUSY 150 mg  150 mg Intramuscular Q90 days Porfirio Oarhelle Jeffery, PA-C   150 mg at 02/25/16 [id number]   Social History   Social History  . Marital Status: Single    Spouse Name: n/a  . Number of Children: 1  . Years of Education: 12+   Occupational History  . Medical Billing     [employer]   Social History Main Topics  . Smoking status: Never Smoker   . Smokeless tobacco: Never Used  . Alcohol Use: 0.0 - 1.0 oz/week    0-2 Standard drinks or equivalent per week  . Drug Use: No  . Sexual Activity:    Partners: Male    Birth Control/ Protection: Condom     Comment: inconsistent condom use; NuvaRing caused abdominal pain   Other Topics Concern  . Not on file   Social History Narrative   Marital status: single; dating casually.      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      Tobacco: none      Alcohol: socially; no DWIs.      Drugs: none      Exercise: none      Seatbelt: 100%      Guns: none      Sexually:  Total partners = 4; no STDs.  Condoms 90%   Family History  Problem Relation Age of Onset  . Cancer Brother 13    Hodgkin's Lymphoma in remission  . Cancer Paternal Aunt     BRAIN  . Breast cancer Maternal Grandmother   . Cancer Maternal Grandmother  BRAIN  . Asthma Son        Objective:    BP 118/76 mmHg  Pulse 77  Temp(Src) 98.5 F (36.9 C) (Oral)  Resp 18  Ht 5\' 3"  (1.6 m)  Wt 115 lb (52.164 kg)  BMI 20.38 kg/m2  SpO2 100%  LMP  (LMP Unknown) Physical Exam  Constitutional: She is oriented to person, place, and time. She appears well-developed and well-nourished. No distress.  HENT:  Head: Normocephalic and atraumatic.  Left Ear: External ear normal.  Eyes: Conjunctivae and EOM are normal. Pupils are equal, round, and reactive to light.  Neck: Normal range of motion. Neck supple. Carotid bruit is not present. No thyromegaly present.    Cardiovascular: Normal rate, regular rhythm, normal heart sounds and intact distal pulses.  Exam reveals no gallop and no friction rub.   No murmur heard. Pulmonary/Chest: Effort normal and breath sounds normal. She has no wheezes. She has no rales.  Abdominal: Soft. Bowel sounds are normal. She exhibits no distension and no mass. There is no tenderness. There is no rebound and no guarding.  Genitourinary: Uterus normal. There is no rash, tenderness or lesion on the right labia. There is no rash, tenderness or lesion on the left labia. Vaginal discharge found.  Lymphadenopathy:    She has no cervical adenopathy.  Neurological: She is alert and oriented to person, place, and time. No cranial nerve deficit.  Skin: Skin is warm and dry. No rash noted. She is not diaphoretic. No erythema. No pallor.  Psychiatric: She has a normal mood and affect. Her behavior is normal.   Results for orders placed or performed in visit on 05/12/16  POCT Wet + KOH Prep  Result Value Ref Range   Yeast by KOH Present Present, Absent   Yeast by wet prep Present Present, Absent   WBC by wet prep Moderate (A) None, Few, Too numerous to count   Clue Cells Wet Prep HPF POC Few (A) None, Too numerous to count   Trich by wet prep Absent Present, Absent   Bacteria Wet Prep HPF POC Few None, Few, Too numerous to count   Epithelial Cells By Principal FinancialWet Pref ([facility]) Moderate (A) None, Few, Too numerous to count   RBC,UR,HPF,POC None None RBC/hpf       Assessment & Plan:   1. Candidiasis of vulva and vagina   2. Vaginal discharge     Orders Placed This Encounter  Procedures  . POCT Wet + KOH Prep   Meds ordered this encounter  Medications  . fluconazole (DIFLUCAN) 150 MG tablet    Sig: Take 1 tablet (150 mg total) by mouth once. Repeat if needed    Dispense:  2 tablet    Refill:  0    No Follow-up on file.    Kristi Paulita FujitaMartin Smith, M.D. [facility]  [place] [address] [place], [place]   [id number] [phone] phone [phone] fax

## 2016-05-12 NOTE — Patient Instructions (Addendum)
   IF you received an x-ray today, you will receive an invoice from [facility]. Please contact [facility] at [phone] with questions or concerns regarding your invoice.   IF you received labwork today, you will receive an invoice from [facility]. Please contact [facility] at [phone] with questions or concerns regarding your invoice.   Our billing staff will not be able to assist you with questions regarding bills from these companies.  You will be contacted with the lab results as soon as they are available. The fastest way to get your results is to activate your My Chart account. Instructions are located on the last page of this paperwork. If you have not heard from us regarding the results in 2 weeks, please contact this office.      Monilial Vaginitis Vaginitis in a soreness, swelling and redness (inflammation) of the vagina and vulva. Monilial vaginitis is not a sexually transmitted infection. CAUSES  Yeast vaginitis is caused by yeast (candida) that is normally found in your vagina. With a yeast infection, the candida has overgrown in number to a point that upsets the chemical balance. SYMPTOMS   White, thick vaginal discharge.  Swelling, itching, redness and irritation of the vagina and possibly the lips of the vagina (vulva).  Burning or painful urination.  Painful intercourse. DIAGNOSIS  Things that may contribute to monilial vaginitis are:  Postmenopausal and virginal states.  Pregnancy.  Infections.  Being tired, sick or stressed, especially if you had monilial vaginitis in the past.  Diabetes. Good control will help lower the chance.  Birth control pills.  Tight fitting garments.  Using bubble bath, feminine sprays, douches or deodorant tampons.  Taking certain medications that kill germs (antibiotics).  Sporadic recurrence can occur if you become ill. TREATMENT  Your caregiver will give you  medication.  There are several kinds of anti monilial vaginal creams and suppositories specific for monilial vaginitis. For recurrent yeast infections, use a suppository or cream in the vagina 2 times a week, or as directed.  Anti-monilial or steroid cream for the itching or irritation of the vulva may also be used. Get your caregiver's permission.  Painting the vagina with methylene blue solution may help if the monilial cream does not work.  Eating yogurt may help prevent monilial vaginitis. HOME CARE INSTRUCTIONS   Finish all medication as prescribed.  Do not have sex until treatment is completed or after your caregiver tells you it is okay.  Take warm sitz baths.  Do not douche.  Do not use tampons, especially scented ones.  Wear cotton underwear.  Avoid tight pants and panty hose.  Tell your sexual partner that you have a yeast infection. They should go to their caregiver if they have symptoms such as mild rash or itching.  Your sexual partner should be treated as well if your infection is difficult to eliminate.  Practice safer sex. Use condoms.  Some vaginal medications cause latex condoms to fail. Vaginal medications that harm condoms are:  Cleocin cream.  Butoconazole (Femstat).  Terconazole (Terazol) vaginal suppository.  Miconazole (Monistat) (may be purchased over the counter). SEEK MEDICAL CARE IF:   You have a temperature by mouth above 102 F (38.9 C).  The infection is getting worse after 2 days of treatment.  The infection is not getting better after 3 days of treatment.  You develop blisters in or around your vagina.  You develop vaginal bleeding, and it is not your menstrual period.  You have   pain when you urinate.  You develop intestinal problems.  You have pain with sexual intercourse.   This information is not intended to replace advice given to you by your health care provider. Make sure you discuss any questions you have with your  health care provider.   Document Released: 08/11/2005 Document Revised: 01/24/2012 Document Reviewed: 05/05/2015 Elsevier Interactive Patient Education 2016 Elsevier Inc.  

## 2017-01-03 ENCOUNTER — Other Ambulatory Visit: Payer: Self-pay | Admitting: Physician Assistant

## 2017-01-03 DIAGNOSIS — N920 Excessive and frequent menstruation with regular cycle: Secondary | ICD-10-CM

## 2017-01-03 NOTE — Telephone Encounter (Signed)
Last ov and refill 04/2016

## 2017-01-04 NOTE — Telephone Encounter (Signed)
Ibuprofen approved; ninety tablets not approved.

## 2017-01-14 ENCOUNTER — Ambulatory Visit (INDEPENDENT_AMBULATORY_CARE_PROVIDER_SITE_OTHER): Payer: 59 | Admitting: Family Medicine

## 2017-01-14 VITALS — BP 122/72 | HR 103 | Temp 98.5°F | Resp 17 | Ht 63.5 in | Wt 114.0 lb

## 2017-01-14 DIAGNOSIS — J301 Allergic rhinitis due to pollen: Secondary | ICD-10-CM | POA: Diagnosis not present

## 2017-01-14 DIAGNOSIS — Z113 Encounter for screening for infections with a predominantly sexual mode of transmission: Secondary | ICD-10-CM

## 2017-01-14 DIAGNOSIS — Z30013 Encounter for initial prescription of injectable contraceptive: Secondary | ICD-10-CM | POA: Diagnosis not present

## 2017-01-14 LAB — POCT URINE PREGNANCY: Preg Test, Ur: NEGATIVE

## 2017-01-14 MED ORDER — MEDROXYPROGESTERONE ACETATE 150 MG/ML IM SUSY
150.0000 mg | PREFILLED_SYRINGE | INTRAMUSCULAR | Status: AC
  Administered 2017-01-14 – 2017-12-10 (×4): 150 mg via INTRAMUSCULAR

## 2017-01-14 MED ORDER — FLUTICASONE PROPIONATE 50 MCG/ACT NA SUSP: 2.0000 | Freq: Every day | NASAL | 6 refills | Status: DC

## 2017-01-14 MED ORDER — CETIRIZINE HCL 10 MG PO TABS: 10.0000 mg | ORAL_TABLET | Freq: Every day | ORAL | 11 refills | Status: DC

## 2017-01-14 NOTE — Progress Notes (Signed)
Chief Complaint  Patient presents with  . patient wants to be checked for STD"s but has no symtoms    HPI   Pt would like to get screened for stds She denies genital lesions or vaginal discharge She is sexually active with a female  Contraception Counseling: Patient presents for contraception counseling. The patient has no complaints today. The patient is sexually active. Pertinent NEGATIVE past medical history: pt denies being a current smoker, hypertension, liver disease, migraines, pelvic inflammatory disease, sexually transmitted diseases, thromboembolism and urinary tract infections.  Patient's last menstrual period was 12/29/2016 (approximate). She tried ocp's in the past but missed days at a time  She tried nuvaring but did not like it She tried one depo shot before but stopped due to the heavy bleeding. She is interested in Depo.   Allergic Rhinitis: Claudia Jackson is here for evaluation of possible allergic rhinitis. Patient's symptoms include clear rhinorrhea, itchy eyes and swelling of eyes. These symptoms are seasonal. Current triggers include exposure to no known precipitant but it happens at work and starts in the morning.  The patient has been suffering from these symptoms for approximately 4 days. The patient has tried over the counter medications with good relief of symptoms. Immunotherapy has never been tried. The patient has never had nasal polyps. The patient has no history of asthma. The patient does not suffer from frequent sinopulmonary infections. The patient has not had sinus surgery in the past. The patient has no history of eczema.    Past Medical History:  Diagnosis Date  . Allergy   . Anemia     Current Outpatient Prescriptions  Medication Sig Dispense Refill  . cetirizine (ZYRTEC) 10 MG tablet Take 1 tablet (10 mg total) by mouth daily. 30 tablet 11  . fluticasone (FLONASE) 50 MCG/ACT nasal spray Place 2 sprays into both nostrils daily. 16 g 6    Current Facility-Administered Medications  Medication Dose Route Frequency Provider Last Rate Last Dose  . MedroxyPROGESTERone Acetate SUSY 150 mg  150 mg Intramuscular Q90 days Doristine BosworthZoe A Stallings, MD        Allergies: No Known Allergies  No past surgical history on file.  Social History   Social History  . Marital status: Single    Spouse name: n/a  . Number of children: 1  . Years of education: 12+   Occupational History  . Medical Billing     [employer]   Social History Main Topics  . Smoking status: Never Smoker  . Smokeless tobacco: Never Used  . Alcohol use 0.0 - 1.0 oz/week  . Drug use: No  . Sexual activity: Yes    Partners: Male    Birth control/ protection: Condom     Comment: inconsistent condom use; NuvaRing caused abdominal pain   Other Topics Concern  . None   Social History Narrative   Marital status: single; dating casually.      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      Tobacco: none      Alcohol: socially; no DWIs.      Drugs: none      Exercise: none      Seatbelt: 100%      Guns: none      Sexually:  Total partners = 4; no STDs.  Condoms 90%    Review of Systems  Constitutional: Negative for chills and fever.  HENT:       See hpi  Gastrointestinal: Negative for abdominal pain, constipation, diarrhea, nausea and vomiting.  Neurological: Negative for dizziness, tingling, tremors, sensory change and headaches.    Objective: Vitals:   01/14/17 0936  BP: 122/72  Pulse: (!) 103  Resp: 17  Temp: 98.5 F (36.9 C)  TempSrc: Oral  SpO2: 99%  Weight: 114 lb (51.7 kg)  Height: 5' 3.5" (1.613 m)    Physical Exam General: alert, oriented, in NAD Head: normocephalic, atraumatic, no sinus tenderness Eyes: EOM intact, no scleral icterus or conjunctival injection Ears: TM clear bilaterally Throat: no pharyngeal exudate or erythema Lymph: no posterior auricular, submental  or cervical lymph adenopathy Heart: normal rate, normal sinus rhythm, no murmurs Lungs: clear to auscultation bilaterally, no wheezing   Assessment and Plan [name] was seen today for patient wants to be checked for std"s but has no symtoms.  Diagnoses and all orders for this visit:  Screen for STD (sexually transmitted disease)- discussed condom use to prevent stds -     HIV antibody -     GC/Chlamydia Probe Amp -     Hepatitis B surface antigen -     RPR  Encounter for initial prescription of injectable contraceptive- discussed options Pt elected depo today Advised that many people have bleeding with first depo that she should at least do 2 rounds to figure out how her body will respond -     POCT urine pregnancy -     MedroxyPROGESTERone Acetate SUSY 150 mg; Inject 1 mL (150 mg total) into the muscle every 3 (three) months.  Acute seasonal allergic rhinitis due to pollen Discussed trying a mask when outdoors  Antihistamine and nasal steroid -     fluticasone (FLONASE) 50 MCG/ACT nasal spray; Place 2 sprays into both nostrils daily. -     cetirizine (ZYRTEC) 10 MG tablet; Take 1 tablet (10 mg total) by mouth daily.     Claudia Jackson

## 2017-01-14 NOTE — Patient Instructions (Addendum)
IF you received an x-ray today, you will receive an invoice from [facility]. Please contact [facility] at [phone] with questions or concerns regarding your invoice.   IF you received labwork today, you will receive an invoice from [place]. Please contact [facility] at [phone] with questions or concerns regarding your invoice.   Our billing staff will not be able to assist you with questions regarding bills from these companies.  You will be contacted with the lab results as soon as they are available. The fastest way to get your results is to activate your My Chart account. Instructions are located on the last page of this paperwork. If you have not heard from [place] regarding the results in 2 weeks, please contact this office.      Hormonal Contraception Information Hormonal contraception is a type of birth control that uses hormones to prevent pregnancy. It usually involves a combination of the hormones estrogen and progesterone or only the hormone progesterone. Hormonal contraception works in these ways:  It thickens the mucus in the cervix, making it harder for sperm to enter the uterus.  It changes the lining of the uterus, making it harder for an egg to implant.  It may stop the ovaries from releasing eggs (ovulation). Some women who take hormonal contraceptives that contain only progesterone may continue to ovulate. Hormonal contraception cannot prevent sexually transmitted infections (STIs). Pregnancy may still occur. Estrogen and progesterone contraceptives  Contraceptives that use a combination of estrogen and progesterone are available in these forms:  Pill. Pills come in different combinations of hormones. They must be taken at the same time each day. Pills can affect your period, causing you to get your period once every three months or not at all.  Patch. The patch must be worn on the lower abdomen for three weeks and then removed on the  fourth.  Vaginal ring. The ring is placed in the vagina and left there for three weeks. It is then removed for one week. Progesterone contraceptives Contraceptives that use progesterone only are available in these forms:  Pill. Pills should be taken every day of the cycle.  Intrauterine device (IUD). This device is inserted into the uterus and removed or replaced every five years or sooner.  Implant. Plastic rods are placed under the skin of the upper arm. They are removed or replaced every three years or sooner.  Injection. The injection is given once every 90 days. What are the side effects? The side effects of estrogen and progesterone contraceptives include:  Nausea.  Headaches.  Breast tenderness.  Bleeding or spotting between menstrual cycles.  High blood pressure (rare).  Strokes, heart attacks, or blood clots (rare) Side effects of progesterone-only contraceptives include:  Nausea.  Headaches.  Breast tenderness.  Unpredictable menstrual bleeding.  High blood pressure (rare). Talk to your health care provider about what side effects may affect you. Where to find more information:  Ask your health care provider for more information and resources about hormonal contraception.  [place] Department of Health and Cytogeneticist on Women's Health: http://hoffman.com/ Questions to ask:  What type of hormonal contraception is right for me?  How long should I plan to use hormonal contraception?  What are the side effects of the hormonal contraception method I choose?  How can I prevent STIs while using hormonal contraception? Contact a health care provider if:  You start taking hormonal contraceptives and you develop persistent or severe side effects. Summary  Estrogen and progesterone  are hormones used in many forms of birth control.  Talk to your health care provider about what side effects may affect you.  Hormonal contraception cannot prevent  sexually transmitted infections (STIs).  Ask your health care provider for more information and resources about hormonal contraception. This information is not intended to replace advice given to you by your health care provider. Make sure you discuss any questions you have with your health care provider. Document Released: 11/21/2007 Document Revised: 10/01/2016 Document Reviewed: 10/01/2016 Elsevier Interactive Patient Education  2017 [employer]

## 2017-01-15 LAB — HIV ANTIBODY (ROUTINE TESTING W REFLEX): HIV Screen 4th Generation wRfx: NONREACTIVE

## 2017-01-15 LAB — RPR: RPR Ser Ql: NONREACTIVE

## 2017-01-15 LAB — HEPATITIS B SURFACE ANTIGEN: Hepatitis B Surface Ag: NEGATIVE

## 2017-01-16 LAB — GC/CHLAMYDIA PROBE AMP
Chlamydia trachomatis, NAA: NEGATIVE
Neisseria gonorrhoeae by PCR: NEGATIVE

## 2017-01-18 ENCOUNTER — Telehealth: Payer: Self-pay | Admitting: Family Medicine

## 2017-01-18 NOTE — Telephone Encounter (Signed)
Pt calling about labs °

## 2017-01-18 NOTE — Telephone Encounter (Signed)
Pt given negative lab results Requesting results be sent to MyChart

## 2017-02-18 ENCOUNTER — Encounter: Payer: Self-pay | Admitting: Physician Assistant

## 2017-02-18 ENCOUNTER — Ambulatory Visit (INDEPENDENT_AMBULATORY_CARE_PROVIDER_SITE_OTHER): Payer: 59 | Admitting: Physician Assistant

## 2017-02-18 VITALS — BP 106/68 | HR 82 | Temp 99.4°F | Resp 18 | Ht 63.5 in | Wt 109.0 lb

## 2017-02-18 DIAGNOSIS — R319 Hematuria, unspecified: Secondary | ICD-10-CM

## 2017-02-18 DIAGNOSIS — N390 Urinary tract infection, site not specified: Secondary | ICD-10-CM | POA: Diagnosis not present

## 2017-02-18 DIAGNOSIS — N760 Acute vaginitis: Secondary | ICD-10-CM

## 2017-02-18 DIAGNOSIS — B9689 Other specified bacterial agents as the cause of diseases classified elsewhere: Secondary | ICD-10-CM | POA: Diagnosis not present

## 2017-02-18 DIAGNOSIS — N898 Other specified noninflammatory disorders of vagina: Secondary | ICD-10-CM

## 2017-02-18 LAB — POCT WET + KOH PREP
Trich by wet prep: ABSENT
YEAST BY WET PREP: ABSENT
Yeast by KOH: ABSENT

## 2017-02-18 LAB — POCT URINALYSIS DIP (MANUAL ENTRY)
BILIRUBIN UA: NEGATIVE
GLUCOSE UA: NEGATIVE
Nitrite, UA: NEGATIVE
Protein Ur, POC: NEGATIVE
SPEC GRAV UA: 1.025 (ref 1.030–1.035)
Urobilinogen, UA: 0.2 (ref ?–2.0)
pH, UA: 5.5 (ref 5.0–8.0)

## 2017-02-18 LAB — POC MICROSCOPIC URINALYSIS (UMFC)

## 2017-02-18 MED ORDER — METRONIDAZOLE 500 MG PO TABS: 500.0000 mg | ORAL_TABLET | Freq: Two times a day (BID) | ORAL | 0 refills | Status: DC

## 2017-02-18 MED ORDER — NITROFURANTOIN MONOHYD MACRO 100 MG PO CAPS: 100.0000 mg | ORAL_CAPSULE | Freq: Two times a day (BID) | ORAL | 0 refills | Status: AC

## 2017-02-18 NOTE — Progress Notes (Signed)
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  SUBJECTIVE  Claudia Jackson is a 31 y.o. female who complains of cloudy malordorous urine and vaginal odor x 7 days. She denies dysuria, hematuria, urinary frequency, flank pain, abdominal pain, pelvic pain, genital rash and vaginal discharge. Has tried soap and different cleanses with no relief. Pt is sexually active. She has not had any new partners since her last STD testing in 01/2017, which was normal. LMP 02/11/17.   She  has a past medical history of Allergy and Anemia.    Medications reviewed and updated by myself where necessary, and exist elsewhere in the encounter.   Ms. Newill has No Known Allergies. She  reports that she has never smoked. She has never used smokeless tobacco. She reports that she drinks alcohol. She reports that she does not use drugs. She  reports that she currently engages in sexual activity and has had female partners. She reports using the following method of birth control/protection: Condom. The patient  has no past surgical history on file.  Her family history includes Asthma in her son; Breast cancer in her maternal grandmother; Cancer in her maternal grandmother and paternal aunt; Cancer (age of onset: 93) in her brother.  Review of Systems  Constitutional: Negative for chills, diaphoresis and fever.  Gastrointestinal: Negative for nausea and vomiting.    OBJECTIVE  Her  height is 5' 3.5" (1.613 m) and weight is 109 lb (49.4 kg). Her oral temperature is 99.4 F (37.4 C). Her blood pressure is 106/68 and her pulse is 82. Her respiration is 18 and oxygen saturation is 98%.  The patient's body mass index is 19.01 kg/m.  Physical Exam  Constitutional: She is oriented to person, place, and  time. She appears well-developed and well-nourished. No distress.  HENT:  Head: Normocephalic and atraumatic.  Eyes: Conjunctivae are normal.  Neck: Normal range of motion.  Respiratory: Effort normal.  GI: Soft. Normal appearance. There is no tenderness. There is no CVA tenderness.  Genitourinary: Cervix exhibits no discharge and no friability. No tenderness in the vagina. No vaginal discharge found.  Genitourinary Comments: Strong fishy vaginal odor noted.   Neurological: She is alert and oriented to person, place, and time.  Skin: Skin is warm and dry.  Psychiatric: She has a normal mood and affect.   Results for orders placed or performed in visit on 02/18/17 (from the past 24 hour(s))  POCT Wet + KOH Prep     Status: Abnormal   Collection Time: 02/18/17  6:32 PM  Result Value Ref Range   Yeast by KOH Absent Absent   Yeast by wet prep Absent Absent   WBC by wet prep Moderate (A) Few   Clue Cells Wet Prep HPF POC Moderate (A) None   Trich by wet prep Absent Absent   Bacteria Wet Prep HPF POC Moderate (A) Few   Epithelial Cells By [employer] ([facility]) None None, Few, Too numerous to count   RBC,UR,HPF,POC Few (A) None RBC/hpf  POCT urinalysis dipstick     Status: Abnormal   Collection Time: 02/18/17  6:32 PM  Result Value Ref Range   Color, UA yellow yellow   Clarity, UA clear clear   Glucose, UA negative negative   Bilirubin, UA negative negative  Ketones, POC UA trace (5) (A) negative   Spec Grav, UA 1.025 1.030 - 1.035   Blood, UA moderate (A) negative   pH, UA 5.5 5.0 - 8.0   Protein Ur, POC negative negative   Urobilinogen, UA 0.2 Negative - 2.0   Nitrite, UA Negative Negative   Leukocytes, UA large (3+) (A) Negative  POCT Microscopic Urinalysis ([facility])     Status: Abnormal   Collection Time: 02/18/17  6:33 PM  Result Value Ref Range   WBC,UR,HPF,POC Too numerous to count  (A) None WBC/hpf   RBC,UR,HPF,POC Moderate (A) None RBC/hpf   Bacteria Many (A) None, Too numerous  to count   Mucus Present (A) Absent   Epithelial Cells, UR Per Microscopy Moderate (A) None, Too numerous to count cells/hpf    ASSESSMENT & PLAN  Kadra was seen today for bv.  Diagnoses and all orders for this visit:  Vaginal odor -     POCT Wet + KOH Prep -     POCT urinalysis dipstick -     POCT Microscopic Urinalysis ([facility]) -     Urine culture  BV (bacterial vaginosis) -     metroNIDAZOLE (FLAGYL) 500 MG tablet; Take 1 tablet (500 mg total) by mouth 2 (two) times daily with a meal. DO NOT CONSUME ALCOHOL WHILE TAKING THIS MEDICATION.  Urinary tract infection with hematuria, site unspecified -     nitrofurantoin, macrocrystal-monohydrate, (MACROBID) 100 MG capsule; Take 1 capsule (100 mg total) by mouth 2 (two) times daily.   The patient was advised to call or come back to clinic if she does not see an improvement in symptoms, or worsens with the above plan.   Benjiman Core, PA-C [facility] 02/18/2017 6:54 PM

## 2017-02-18 NOTE — Patient Instructions (Addendum)
It appears that you are having both a UTI and bacterial vaginosis. Therefore, I am going to prescribe you two medications. Do not consume alcohol while on the medication as it can make you really sick. I will also send off a urine culture and if the bacteria that grows is resistant to the antibiotic I prescribed I will change it. If you develop any abdomina pain, back pain, nausea, vomiting, fever, or chills, please return to our office immediately. Thank you for letting me participate in your health and well being.    Urinary Tract Infection, Adult A urinary tract infection (UTI) is an infection of any part of the urinary tract. The urinary tract includes the:  Kidneys.  Ureters.  Bladder.  Urethra. These organs make, store, and get rid of pee (urine) in the body. Follow these instructions at home:  Take over-the-counter and prescription medicines only as told by your doctor.  If you were prescribed an antibiotic medicine, take it as told by your doctor. Do not stop taking the antibiotic even if you start to feel better.  Avoid the following drinks:  Alcohol.  Caffeine.  Tea.  Carbonated drinks.  Drink enough fluid to keep your pee clear or pale yellow.  Keep all follow-up visits as told by your doctor. This is important.  Make sure to:  Empty your bladder often and completely. Do not to hold pee for long periods of time.  Empty your bladder before and after sex.  Wipe from front to back after a bowel movement if you are female. Use each tissue one time when you wipe. Contact a doctor if:  You have back pain.  You have a fever.  You feel sick to your stomach (nauseous).  You throw up (vomit).  Your symptoms do not get better after 3 days.  Your symptoms go away and then come back. Get help right away if:  You have very bad back pain.  You have very bad lower belly (abdominal) pain.  You are throwing up and cannot keep down any medicines or water. This  information is not intended to replace advice given to you by your health care provider. Make sure you discuss any questions you have with your health care provider. Document Released: 04/19/2008 Document Revised: 04/08/2016 Document Reviewed: 09/22/2015 Elsevier Interactive Patient Education  2017 Elsevier Inc.  Bacterial Vaginosis Bacterial vaginosis is an infection of the vagina. It happens when too many germs (bacteria) grow in the vagina. This infection puts you at risk for infections from sex (STIs). Treating this infection can lower your risk for some STIs. You should also treat this if you are pregnant. It can cause your baby to be born early. Follow these instructions at home: Medicines   Take over-the-counter and prescription medicines only as told by your doctor.  Take or use your antibiotic medicine as told by your doctor. Do not stop taking or using it even if you start to feel better. General instructions   If you your sexual partner is a woman, tell her that you have this infection. She needs to get treatment if she has symptoms. If you have a female partner, he does not need to be treated.  During treatment:  Avoid sex.  Do not douche.  Avoid alcohol as told.  Avoid breastfeeding as told.  Drink enough fluid to keep your pee (urine) clear or pale yellow.  Keep your vagina and butt (rectum) clean.  Wash the area with warm water every day.  Wipe from front to back after you use the toilet.  Keep all follow-up visits as told by your doctor. This is important. Preventing this condition   Do not douche.  Use only warm water to wash around your vagina.  Use protection when you have sex. This includes:  Latex condoms.  Dental dams.  Limit how many people you have sex with. It is best to only have sex with the same person (be monogamous).  Get tested for STIs. Have your partner get tested.  Wear underwear that is cotton or lined with cotton.  Avoid tight  pants and pantyhose. This is most important in summer.  Do not use any products that have nicotine or tobacco in them. These include cigarettes and e-cigarettes. If you need help quitting, ask your doctor.  Do not use illegal drugs.  Limit how much alcohol you drink. Contact a doctor if:  Your symptoms do not get better, even after you are treated.  You have more discharge or pain when you pee (urinate).  You have a fever.  You have pain in your belly (abdomen).  You have pain with sex.  Your bleed from your vagina between periods. Summary  This infection happens when too many germs (bacteria) grow in the vagina.  Treating this condition can lower your risk for some infections from sex (STIs).  You should also treat this if you are pregnant. It can cause early (premature) birth.  Do not stop taking or using your antibiotic medicine even if you start to feel better. This information is not intended to replace advice given to you by your health care provider. Make sure you discuss any questions you have with your health care provider. Document Released: 08/10/2008 Document Revised: 07/17/2016 Document Reviewed: 07/17/2016 Elsevier Interactive Patient Education  2017 [employer].     IF you received an x-ray today, you will receive an invoice from [facility]. Please contact [facility] at [phone] with questions or concerns regarding your invoice.   IF you received labwork today, you will receive an invoice from [facility]. Please contact [facility] at [phone] with questions or concerns regarding your invoice.   Our billing staff will not be able to assist you with questions regarding bills from these companies.  You will be contacted with the lab results as soon as they are available. The fastest way to get your results is to activate your My Chart account. Instructions are located on the last page of this paperwork. If you have not heard from [place]  regarding the results in 2 weeks, please contact this office.

## 2017-02-20 LAB — URINE CULTURE: Organism ID, Bacteria: NO GROWTH

## 2017-02-21 ENCOUNTER — Telehealth: Payer: Self-pay | Admitting: Family Medicine

## 2017-02-21 ENCOUNTER — Encounter: Payer: Self-pay | Admitting: Physician Assistant

## 2017-02-21 NOTE — Telephone Encounter (Signed)
See your mychart message and labs

## 2017-02-21 NOTE — Telephone Encounter (Signed)
Pt calling Claudia Jackson back stating that she still see some blood in her urine even though she don't have a UTI would like for you to respond back

## 2017-02-23 NOTE — Telephone Encounter (Signed)
Contacted pt via mychart.

## 2017-03-22 ENCOUNTER — Telehealth: Payer: Self-pay | Admitting: Physician Assistant

## 2017-03-22 NOTE — Telephone Encounter (Signed)
Pt would like to know when she should come in for another depo shot. Please advise at [phone]

## 2017-03-23 NOTE — Telephone Encounter (Signed)
Pt advised she needs to return for next Depo injection 5/19-6/2 Verbalized understanding

## 2017-03-28 ENCOUNTER — Ambulatory Visit (INDEPENDENT_AMBULATORY_CARE_PROVIDER_SITE_OTHER): Payer: 59 | Admitting: Urgent Care

## 2017-03-28 DIAGNOSIS — Z30013 Encounter for initial prescription of injectable contraceptive: Secondary | ICD-10-CM | POA: Diagnosis not present

## 2017-03-28 DIAGNOSIS — Z3042 Encounter for surveillance of injectable contraceptive: Secondary | ICD-10-CM

## 2017-03-30 ENCOUNTER — Encounter: Payer: Self-pay | Admitting: Gynecology

## 2017-05-24 ENCOUNTER — Telehealth: Payer: Self-pay | Admitting: Family Medicine

## 2017-05-24 NOTE — Telephone Encounter (Signed)
PT WAS CALLING TO SEE WHEN THE NEXT TIME HER DEPO SHOT IS DUE I ADVISED HER FROM July 30TH -AUG 13TH PT WILL COME INTO CLIINIC AROUND THAT TIME

## 2017-06-11 ENCOUNTER — Ambulatory Visit (INDEPENDENT_AMBULATORY_CARE_PROVIDER_SITE_OTHER): Payer: 59 | Admitting: Physician Assistant

## 2017-06-11 VITALS — BP 97/59 | HR 76 | Temp 98.5°F | Resp 18 | Ht 63.5 in | Wt 109.0 lb

## 2017-06-11 DIAGNOSIS — Z30013 Encounter for initial prescription of injectable contraceptive: Secondary | ICD-10-CM | POA: Diagnosis not present

## 2017-06-11 DIAGNOSIS — Z3042 Encounter for surveillance of injectable contraceptive: Secondary | ICD-10-CM

## 2017-06-11 NOTE — Treatment Plan (Signed)
Pt came in early to receive Depo shot early per Chelle. I spoke with Chelle to confirm.

## 2017-06-11 NOTE — Patient Instructions (Signed)
     IF you received an x-ray today, you will receive an invoice from [facility]. Please contact [facility] at [phone] with questions or concerns regarding your invoice.   IF you received labwork today, you will receive an invoice from [facility]. Please contact [facility] at [phone] with questions or concerns regarding your invoice.   Our billing staff will not be able to assist you with questions regarding bills from these companies.  You will be contacted with the lab results as soon as they are available. The fastest way to get your results is to activate your My Chart account. Instructions are located on the last page of this paperwork. If you have not heard from us regarding the results in 2 weeks, please contact this office.     

## 2017-06-12 NOTE — Progress Notes (Signed)
Depo provera injection only.

## 2017-07-23 DIAGNOSIS — H6002 Abscess of left external ear: Secondary | ICD-10-CM | POA: Diagnosis not present

## 2017-09-03 ENCOUNTER — Telehealth: Payer: Self-pay | Admitting: Physician Assistant

## 2017-09-03 NOTE — Telephone Encounter (Signed)
Pt is needing to talk with someone about her depo shot   And the window to schedule appt   Best number [phone]

## 2017-09-06 NOTE — Telephone Encounter (Signed)
Called Pt and she states someone called her back on Monday and answered her question.

## 2017-09-10 ENCOUNTER — Ambulatory Visit (INDEPENDENT_AMBULATORY_CARE_PROVIDER_SITE_OTHER): Payer: 59 | Admitting: Family Medicine

## 2017-09-10 DIAGNOSIS — Z3042 Encounter for surveillance of injectable contraceptive: Secondary | ICD-10-CM | POA: Diagnosis not present

## 2017-09-10 MED ORDER — MEDROXYPROGESTERONE ACETATE 150 MG/ML IM SUSP
150.0000 mg | Freq: Once | INTRAMUSCULAR | Status: AC
  Administered 2017-09-10: 150 mg via INTRAMUSCULAR

## 2017-09-10 MED ORDER — METHYLPREDNISOLONE ACETATE 80 MG/ML IJ SUSP: 150.0000 mg | Freq: Once | INTRAMUSCULAR | Status: DC

## 2017-09-13 NOTE — Progress Notes (Signed)
Came in for depo, not seen by provider

## 2017-09-15 ENCOUNTER — Encounter (HOSPITAL_COMMUNITY): Payer: Self-pay | Admitting: Emergency Medicine

## 2017-09-15 ENCOUNTER — Emergency Department (HOSPITAL_COMMUNITY)
Admission: EM | Admit: 2017-09-15 | Discharge: 2017-09-15 | Disposition: A | Discharge status: Home / Self Care | Payer: Worker's Compensation | Attending: Emergency Medicine | Admitting: Emergency Medicine

## 2017-09-15 DIAGNOSIS — Z79899 Other long term (current) drug therapy: Secondary | ICD-10-CM | POA: Diagnosis not present

## 2017-09-15 DIAGNOSIS — Y990 Civilian activity done for income or pay: Secondary | ICD-10-CM | POA: Insufficient documentation

## 2017-09-15 DIAGNOSIS — Y929 Unspecified place or not applicable: Secondary | ICD-10-CM | POA: Insufficient documentation

## 2017-09-15 DIAGNOSIS — Y9389 Activity, other specified: Secondary | ICD-10-CM | POA: Insufficient documentation

## 2017-09-15 DIAGNOSIS — W868XXA Exposure to other electric current, initial encounter: Secondary | ICD-10-CM | POA: Diagnosis not present

## 2017-09-15 DIAGNOSIS — T754XXA Electrocution, initial encounter: Secondary | ICD-10-CM | POA: Diagnosis not present

## 2017-09-15 NOTE — Discharge Instructions (Signed)
Ice to area of tingling.  Follow up with your Physician for recheck

## 2017-09-15 NOTE — ED Triage Notes (Signed)
Pt st's she was plugging in a cord at work and it shocked her.  St's she has pain in right hand.  No burns present

## 2017-09-15 NOTE — ED Provider Notes (Signed)
[facility] Note   CSN: [id number] Arrival date & time: 09/15/17  1542     History   Chief Complaint Chief Complaint  Patient presents with  . Electric Shock    HPI Claudia Jackson is a 31 y.o. female.  The history is provided by the patient. No language interpreter was used.  Hand Injury   The incident occurred 3 to 5 hours ago. The incident occurred at work. Injury mechanism: electrical shock. The pain is present in the right hand. The quality of the pain is described as aching. The pain has been constant since the incident. She reports no foreign bodies present. She has tried nothing for the symptoms.   Pt was plugging in a machine at work and was shocked.  Pt reports normal outlet.  She reports wires were frayed.  (regular outlet)  Pt complains of tingling in her hand.  Pt was seen at clinic and came here for further evaluation Past Medical History:  Diagnosis Date  . Allergy   . Anemia     Patient Active Problem List   Diagnosis Date Noted  . 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 Menorrhagia with regular cycle 05/02/2014  . Cervical dysplasia, mild 05/02/2014  . Iron deficiency anemia due to chronic blood loss 05/02/2014    History reviewed. No pertinent surgical history.  OB History    Gravida Para Term Preterm AB Living   1 1       1    SAB TAB Ectopic Multiple Live Births                   Home Medications    Prior to Admission medications   Medication Sig Start Date End Date Taking? Authorizing Provider  cetirizine (ZYRTEC) 10 MG tablet Take 1 tablet (10 mg total) by mouth daily. 01/14/17   Doristine BosworthStallings, Zoe A, MD  fluticasone (FLONASE) 50 MCG/ACT nasal spray Place 2 sprays into both nostrils daily. 01/14/17   Doristine BosworthStallings, Zoe A, MD  metroNIDAZOLE (FLAGYL) 500 MG tablet Take 1 tablet (500 mg total) by mouth 2 (two) times daily with a meal. DO NOT CONSUME ALCOHOL WHILE TAKING THIS  MEDICATION. 02/18/17   Magdalene RiverWiseman, Brittany D, PA-C    Family History Family History  Problem Relation Age of Onset  . Cancer Brother 13       Hodgkin's Lymphoma in remission  . Asthma Son   . Cancer Paternal Aunt        BRAIN  . Breast cancer Maternal Grandmother   . Cancer Maternal Grandmother        BRAIN    Social History Social History  Substance Use Topics  . Smoking status: Never Smoker  . Smokeless tobacco: Never Used  . Alcohol use 0.0 - 1.0 oz/week     Allergies   Patient has no known allergies.   Review of Systems Review of Systems  Neurological: Positive for numbness.  All other systems reviewed and are negative.    Physical Exam Updated Vital Signs BP 127/77   Pulse 85   Temp 97.7 F (36.5 C) (Oral)   Resp 18   Ht 5\' 3"  (1.6 m)   Wt 53.5 kg (118 lb)   SpO2 95%   BMI 20.90 kg/m   Physical Exam  Constitutional: She is oriented to person, place, and time. She appears well-developed and well-nourished.  HENT:  Head: Normocephalic and atraumatic.  Cardiovascular: Normal rate.  Pulmonary/Chest: Effort normal.  Musculoskeletal: Normal range of motion.  No burn, no redness or swelling,    Neurological: She is alert and oriented to person, place, and time.  Skin: Skin is warm.  Psychiatric: She has a normal mood and affect.     ED Treatments / Results  Labs (all labs ordered are listed, but only abnormal results are displayed) Labs Reviewed - No data to display  EKG  EKG Interpretation None       Radiology No results found.  Procedures Procedures (including critical care time)  Medications Ordered in ED Medications - No data to display   Initial Impression / Assessment and Plan / ED Course  I have reviewed the triage vital signs and the nursing notes.  Pertinent labs & imaging results that were available during my care of the patient were reviewed by me and considered in my medical decision making (see chart for details).       Low voltage shock 110 outlet,  No obvious injury,  Final Clinical Impressions(s) / ED Diagnoses   Final diagnoses:  Electrical shock of hand, initial encounter    New Prescriptions New Prescriptions   No medications on file  Pt advised ice may help, Ibuprofen for soreness.     Elson Areas, PA-C 09/15/17 1650    Bethann Berkshire, MD 09/16/17 [phone]

## 2017-12-10 ENCOUNTER — Ambulatory Visit (INDEPENDENT_AMBULATORY_CARE_PROVIDER_SITE_OTHER): Payer: 59

## 2017-12-10 DIAGNOSIS — Z30013 Encounter for initial prescription of injectable contraceptive: Secondary | ICD-10-CM

## 2017-12-10 DIAGNOSIS — Z3042 Encounter for surveillance of injectable contraceptive: Secondary | ICD-10-CM

## 2017-12-10 NOTE — Progress Notes (Signed)
Administrations This Visit    MedroxyPROGESTERone Acetate SUSY 150 mg    Admin Date 12/10/2017 Action Given Dose 150 mg Route Intramuscular Administered By Oneida AlarPeterson, Timika Muench, RN         Patient of Porfirio Oarhelle Jeffery, [place]. Hal MoralesBrittney Wiseman, PA present at time of injection. Patient to return between April 13th and April 27th 2019 for next injection.

## 2018-02-04 ENCOUNTER — Ambulatory Visit: Payer: 59

## 2018-02-16 ENCOUNTER — Encounter: Payer: Self-pay | Admitting: Physician Assistant

## 2018-02-22 ENCOUNTER — Telehealth: Payer: Self-pay | Admitting: Physician Assistant

## 2018-02-22 ENCOUNTER — Other Ambulatory Visit: Payer: Self-pay | Admitting: Physician Assistant

## 2018-02-22 DIAGNOSIS — B9689 Other specified bacterial agents as the cause of diseases classified elsewhere: Secondary | ICD-10-CM

## 2018-02-22 DIAGNOSIS — N760 Acute vaginitis: Secondary | ICD-10-CM

## 2018-02-22 NOTE — Telephone Encounter (Signed)
Copied from CRM [id number]. Topic: Quick Communication - Rx Refill/Question >> Feb 22, 2018 11:34 AM Alexander BergeronBarksdale, Harvey B wrote: Medication: metroNIDAZOLE (FLAGYL) 500 MG tablet [[id number]][[id number]]  Has the patient contacted their pharmacy? Yes.   (Agent: If no, request that the patient contact the pharmacy for the refill.) Preferred Pharmacy (with phone number or street name): [facility]  [address] Agent: Please be advised that RX refills may take up to 3 business days. We ask that you follow-up with your pharmacy.

## 2018-02-22 NOTE — Telephone Encounter (Signed)
Pt called back. Note routed to practice.

## 2018-02-22 NOTE — Telephone Encounter (Signed)
Requesting refill of Flagyl  LOV 09/10/17  Dr. Leretha PolSantiago  Pt states she was told while on birth control to call for refill when needed. Pt states she "Feels symptoms coming on." No discharge.  LRF 02/18/17

## 2018-02-22 NOTE — Telephone Encounter (Signed)
Message left  On Vm  For  Patient  To call  Back to  Discuss   Symptoms

## 2018-02-22 NOTE — Telephone Encounter (Signed)
Advised patient she needs an appointment for the refill per flow conversation with Steward DroneBrenda. Patient declined to schedule.

## 2018-02-25 ENCOUNTER — Telehealth: Payer: Self-pay | Admitting: Physician Assistant

## 2018-02-27 NOTE — Telephone Encounter (Signed)
No call documentation

## 2018-03-02 DIAGNOSIS — Z79899 Other long term (current) drug therapy: Secondary | ICD-10-CM | POA: Diagnosis not present

## 2018-03-04 ENCOUNTER — Ambulatory Visit (INDEPENDENT_AMBULATORY_CARE_PROVIDER_SITE_OTHER): Payer: 59 | Admitting: Family Medicine

## 2018-03-04 DIAGNOSIS — Z3042 Encounter for surveillance of injectable contraceptive: Secondary | ICD-10-CM | POA: Diagnosis not present

## 2018-03-04 MED ORDER — MEDROXYPROGESTERONE ACETATE 150 MG/ML IM SUSP
150.0000 mg | Freq: Once | INTRAMUSCULAR | Status: AC
  Administered 2018-03-04: 150 mg via INTRAMUSCULAR

## 2018-03-04 NOTE — Progress Notes (Signed)
Depo injection

## 2018-03-31 ENCOUNTER — Encounter: Payer: Self-pay | Admitting: Family Medicine

## 2018-04-05 ENCOUNTER — Encounter: Payer: Self-pay | Admitting: Family Medicine

## 2018-04-06 NOTE — Telephone Encounter (Signed)
error 

## 2018-05-09 ENCOUNTER — Other Ambulatory Visit: Payer: Self-pay

## 2018-05-09 ENCOUNTER — Ambulatory Visit: Payer: Self-pay | Admitting: *Deleted

## 2018-05-09 ENCOUNTER — Encounter: Payer: Self-pay | Admitting: Physician Assistant

## 2018-05-09 ENCOUNTER — Telehealth: Payer: Self-pay | Admitting: Radiology

## 2018-05-09 ENCOUNTER — Ambulatory Visit: Payer: 59 | Admitting: Physician Assistant

## 2018-05-09 VITALS — BP 112/76 | HR 75 | Temp 98.7°F | Resp 17 | Ht 63.0 in | Wt 126.0 lb

## 2018-05-09 DIAGNOSIS — R293 Abnormal posture: Secondary | ICD-10-CM

## 2018-05-09 DIAGNOSIS — M545 Low back pain: Secondary | ICD-10-CM | POA: Diagnosis not present

## 2018-05-09 DIAGNOSIS — R251 Tremor, unspecified: Secondary | ICD-10-CM | POA: Diagnosis not present

## 2018-05-09 DIAGNOSIS — R519 Headache, unspecified: Secondary | ICD-10-CM

## 2018-05-09 DIAGNOSIS — R51 Headache: Secondary | ICD-10-CM

## 2018-05-09 DIAGNOSIS — G8929 Other chronic pain: Secondary | ICD-10-CM | POA: Diagnosis not present

## 2018-05-09 DIAGNOSIS — E639 Nutritional deficiency, unspecified: Secondary | ICD-10-CM | POA: Diagnosis not present

## 2018-05-09 LAB — POCT CBC
Granulocyte percent: 2.7 %G — AB (ref 37–80)
HEMATOCRIT: 38.9 % (ref 37.7–47.9)
HEMOGLOBIN: 12.8 g/dL (ref 12.2–16.2)
LYMPH, POC: 47.4 — AB (ref 0.6–3.4)
MCH, POC: 27.4 pg (ref 27–31.2)
MCHC: 32.9 g/dL (ref 31.8–35.4)
MCV: 83.4 fL (ref 80–97)
MID (cbc): 46.2 — AB (ref 0–0.9)
MPV: 6.6 fL (ref 0–99.8)
POC GRANULOCYTE: 0.4 — AB (ref 2–6.9)
POC LYMPH PERCENT: 6.4 %L — AB (ref 10–50)
POC MID %: 2.7 %M (ref 0–12)
Platelet Count, POC: 470 10*3/uL — AB (ref 142–424)
RBC: 4.66 M/uL (ref 4.04–5.48)
RDW, POC: 13.3 %
WBC: 5.8 10*3/uL (ref 4.6–10.2)

## 2018-05-09 LAB — POCT URINALYSIS DIP (MANUAL ENTRY)
BILIRUBIN UA: NEGATIVE
Glucose, UA: NEGATIVE mg/dL
Ketones, POC UA: NEGATIVE mg/dL
NITRITE UA: NEGATIVE
PH UA: 6.5 (ref 5.0–8.0)
Protein Ur, POC: NEGATIVE mg/dL
Spec Grav, UA: 1.02 (ref 1.010–1.025)
Urobilinogen, UA: 0.2 E.U./dL

## 2018-05-09 LAB — GLUCOSE, POCT (MANUAL RESULT ENTRY): POC GLUCOSE: 83 mg/dL (ref 70–99)

## 2018-05-09 NOTE — Telephone Encounter (Signed)
Pt called with feeling tired and having a headache. She stated she thought maybe she was dehydrated, had not had enough to drink. So she had a half bottle of water and felt somewhat better last night but still had a slight headache. She wondered if she had a stroke. No symptoms of a stroke. She is calling from work. No nausea, chest pain, shortness of breath, numbness or tingling or inability to walk or communicate. Advised to drink plenty of water. She is requesting an appointment for this morning, only available this afternoon so she is going to an urgent care.  Home care advice given to her with verbal understanding. Will route to [facility] at [facility].   Reason for Disposition . Poor fluid intake probably caused the weakness  Answer Assessment - Initial Assessment Questions 1. DESCRIPTION: "Describe how you are feeling."     Feeling weak 2. SEVERITY: "How bad is it?"  "Can you stand and walk?"   - MILD - Feels weak or tired, but does not interfere with work, school or normal activities   - MODERATE - Able to stand and walk; weakness interferes with work, school, or normal activities   - SEVERE - Unable to stand or walk     mild 3. ONSET:  "When did the weakness begin?"     yesterday 4. CAUSE: "What do you think is causing the weakness?"     Not sure 5. MEDICINES: "Have you recently started a new medicine or had a change in the amount of a medicine?"     no 6. OTHER SYMPTOMS: "Do you have any other symptoms?" (e.g., chest pain, fever, cough, SOB, vomiting, diarrhea, bleeding)     no 7. PREGNANCY: "Is there any chance you are pregnant?" "When was your last menstrual period?"     No, LMP  On depo  Protocols used: WEAKNESS (GENERALIZED) AND FATIGUE-A-AH

## 2018-05-09 NOTE — Progress Notes (Signed)
Claudia SaranCharmion L Jackson  MRN: [id number] DOB: [birth year]  Subjective:  Claudia Jackson KnifeWilliams is a 32 y.o. female seen in office today for a chief complaint of headache.  Onset: 1 day  Location: right side of forehead Quality: nagging Frequency: intermittent  Precipitating factors: not eating, work  Prior treatment: took two tylenol, didn't really help. Then massaged head and drank some water, felt much better instantly with the water. Then ate a sandwich and felt even more better.  Today, has nagging sensation. Hasn't eaten today and has not drank any water. Been sleeping on a couch for at least one year.  Associated Symptoms Nausea/vomiting: no  Photophobia/phonophobia: no  Tearing of eyes: no  Sinus pain/pressure: no  Family hx migraine: yes, in son  Personal stressors: yes, her son, it has calmed down recently  Relation to menstrual cycle: no   Red Flags Fever: no  Neck pain/stiffness: no  Vision/speech/swallow/hearing difficulty: no  Focal weakness/numbness: no  Altered mental status: no  Trauma: no  New type of headache: no  Anticoagulant use: no  H/o cancer/HIV/current Pregnancy: no   Does not drink water every day. Some days does not drink any fluids. Eats two light meals per day. She is so busy, forgets to eat. Likes veggies, yogurt, meats, pastas, fruits. No multivitamin. No current exercise. She works for [employer].  Sleeps ~5-7 hours a night. Has a 34 yo son.   Patient also like a work note  to have a better supported chair at work.  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  Was told that if she had a doctor so she could get better supportive care.  Denies numbness, tingling, weakness in legs, burning sensation, bladder/bowel incontinence, saddle anesthesia, fever, and chills.  Has PMH of anemia but this is been resolved with the above  injections due to double injections helping with her heavy periods.  Review of Systems  Constitutional: Positive for fatigue. Negative for diaphoresis and unexpected weight change.  Musculoskeletal: Negative for gait problem.  Neurological: Negative for dizziness and light-headedness.  Psychiatric/Behavioral: Negative for confusion.    Patient Active Problem List   Diagnosis Date Noted  . 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 Menorrhagia with regular cycle 05/02/2014  . Cervical dysplasia, mild 05/02/2014  . Iron deficiency anemia due to chronic blood loss 05/02/2014    Current Outpatient Medications on File Prior to Visit  Medication Sig Dispense Refill  . fluticasone (FLONASE) 50 MCG/ACT nasal spray Place 2 sprays into both nostrils daily. 16 g 6  . loratadine (CLARITIN) 10 MG tablet Take 10 mg by mouth daily.     No current facility-administered medications on file prior to visit.     No Known Allergies    Social History   Socioeconomic History  . Marital status: Single    Spouse name: n/a  . Number of children: 1  . Years of education: 12+  . Highest education level: Not on file  Occupational History  . 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 Financial resource strain: Not on file  . Food insecurity:    Worry: Not on file    Inability: Not on file  . Transportation needs:    Medical: Not on file    Non-medical: Not on file  Tobacco Use  . Smoking status: Never Smoker  . Smokeless tobacco: Never Used  Substance and Sexual Activity  . Alcohol use: Yes    Alcohol/week: 0.0 - 1.2 oz  . Drug use: No  . Sexual activity: Yes    Partners: Male    Birth control/protection: Condom    Comment: inconsistent condom use; NuvaRing caused abdominal pain  Lifestyle  . Physical activity:    Days per week: Not on file    Minutes per session: Not on file  . Stress: Not on file  Relationships  . Social connections:     Talks on phone: Not on file    Gets together: Not on file    Attends religious service: Not on file    Active member of club or organization: Not on file    Attends meetings of clubs or organizations: Not on file    Relationship status: Not on file  . Intimate partner violence:    Fear of current or ex partner: Not on file    Emotionally abused: Not on file    Physically abused: Not on file    Forced sexual activity: Not on file  Other Topics Concern  . Not on file  Social History Narrative   Marital status: single; dating casually.      Children: 1 son (8yo)      Lives: with parents, son      Employment: Paramedic; billing; since 2014.      Tobacco: none      Alcohol: socially; no DWIs.      Drugs: none      Exercise: none      Seatbelt: 100%      Guns: none      Sexually:  Total partners = 4; no STDs.  Condoms 90%    Objective:  BP 112/76 (BP Location: Right Arm, Patient Position: Sitting, Cuff Size: Normal)   Pulse 75   Temp 98.7 F (37.1 C) (Oral)   Resp 17   Ht 5\' 3"  (1.6 m)   Wt 126 lb (57.2 kg)   SpO2 97%   BMI 22.32 kg/m   Physical Exam  Constitutional: She is oriented to person, place, and time. She appears well-developed and well-nourished. No distress.  HENT:  Head: Normocephalic and atraumatic.  Mouth/Throat: Uvula is midline, oropharynx is clear and moist and mucous membranes are normal. No tonsillar exudate.  No pain with palpation of bilateral temporal regions.   Eyes: Pupils are equal, round, and reactive to light. Conjunctivae and EOM are normal.  Fundoscopic exam:      The right eye shows no hemorrhage and no papilledema. The right eye shows red reflex.       The left eye shows no hemorrhage and no papilledema. The left eye shows red reflex.  Neck: Normal range of motion and full passive range of motion without pain. No Brudzinski's sign and no Kernig's sign noted.  Cardiovascular: Normal rate, regular rhythm and normal heart sounds.    Pulmonary/Chest: Effort normal.  Musculoskeletal:       Cervical back: She exhibits tenderness (w/ palpation of bilateral trapezius musculature ). She exhibits normal range of motion, no bony tenderness, no swelling and no spasm.       Thoracic back: Normal.       Lumbar back: She exhibits tenderness ( w/ palpation of bilateral lumbar musculature). She exhibits normal range of motion, no bony tenderness and no spasm.  Neurological: She is alert and oriented to  person, place, and time. She has normal strength and normal reflexes. No cranial nerve deficit or sensory deficit. She displays a negative Romberg sign. Gait normal.  Reflex Scores:      Patellar reflexes are 2+ on the right side and 2+ on the left side.      Achilles reflexes are 2+ on the right side and 2+ on the left side. Normal FNF and HTS test.  Normal Tandem walk.  Negative SLR b/l.  Skin: Skin is warm and dry.  Psychiatric: She has a normal mood and affect.  Vitals reviewed.    Results for orders placed or performed in visit on 05/09/18 (from the past 24 hour(s))  POCT glucose (manual entry)     Status: Normal   Collection Time: 05/09/18 12:03 PM  Result Value Ref Range   POC Glucose 83 70 - 99 mg/dl  POCT CBC     Status: Abnormal   Collection Time: 05/09/18 12:15 PM  Result Value Ref Range   WBC 5.8 4.6 - 10.2 K/uL   Lymph, poc 47.4 (A) 0.6 - 3.4   POC LYMPH PERCENT 6.4 (A) 10 - 50 %L   MID (cbc) 46.2 (A) 0 - 0.9   POC MID % 2.7 0 - 12 %M   POC Granulocyte 0.4 (A) 2 - 6.9   Granulocyte percent 2.7 (A) 37 - 80 %G   RBC 4.66 4.04 - 5.48 M/uL   Hemoglobin 12.8 12.2 - 16.2 g/dL   HCT, POC 16.1 09.6 - 47.9 %   MCV 83.4 80 - 97 fL   MCH, POC 27.4 27 - 31.2 pg   MCHC 32.9 31.8 - 35.4 g/dL   RDW, POC 04.5 %   Platelet Count, POC 470 (A) 142 - 424 K/uL   MPV 6.6 0 - 99.8 fL  POCT urinalysis dipstick     Status: Abnormal   Collection Time: 05/09/18 12:30 PM  Result Value Ref Range   Color, UA yellow yellow    Clarity, UA cloudy (A) clear   Glucose, UA negative negative mg/dL   Bilirubin, UA negative negative   Ketones, POC UA negative negative mg/dL   Spec Grav, UA 4.098 1.191 - 1.025   Blood, UA trace-lysed (A) negative   pH, UA 6.5 5.0 - 8.0   Protein Ur, POC negative negative mg/dL   Urobilinogen, UA 0.2 0.2 or 1.0 E.U./dL   Nitrite, UA Negative Negative   Leukocytes, UA Moderate (2+) (A) Negative    Assessment and Plan :  1. Nonintractable headache, unspecified chronicity pattern, unspecified headache type Normal neuro exam. No red flags noted. Pt is overall well appearing, vitals stable.  Headache is only mild in nature and has improved since it started. Suspect multiple etiologies playing a role such as stress, poor diet, dehydration, and poor sleep. Discussed in great detail lifestyle modifications. Given pt goals to strive for before next visit such as  increasing water consumption to at least 40 to 64 ounces per day.  Eating light frequent meals throughout the day.  Start engaging at least 10 to 15 minutes of walking/jogging 2-3 times per week and yoga at least once per week.  Also recommend adding a daily multivitamin to regimen.  2. Shakiness Hgb and glucose stable, pt reassured.  - POCT CBC - POCT glucose (manual entry) - POCT urinalysis dipstick  3. Poor diet 4. Poor posture 5. Chronic bilateral low back pain without sciatica Likely due to poor posture at work. Given pt a work note  encouraging them to provide her with a more supportive chair while at work. Rec daily stretching and increase hydration. May use OTC NSAIDs prn. Advised to return to clinic if symptoms worsen, do not improve, or as needed.   Marland Kitchen Benjiman Core PA-C  Primary Care at [facility] 05/09/2018 1:05 PM

## 2018-05-09 NOTE — Telephone Encounter (Signed)
Called , and spoke with patient , informed of UA results. She stated that she has no symptoms of urgency, frequency. Advised to monitor her symptoms, and if she will have some , to call us.

## 2018-05-09 NOTE — Patient Instructions (Addendum)
I think that your lack of water, elective frequent meals, and lack of exercise are contributing to your overall symptoms.  I recommend increasing water consumption to at least 40 to 64 ounces per day.  Eat light frequent meals throughout the day.  Start engaging at least 10 to 15 minutes of walking/jogging 2-3 times per week and yoga at least once per week.  Also recommend adding a daily multivitamin to your regimen.  Goals before next visit should be 1) increase water consumption, 2) increase meals throughout the day, 3) start engaging in some sort of exercise, and 4) treat yourself with a massage   I have written a note for work if you need any please follow-up in about 1 month for complete physical exam.  Will obtain all your lab work at this time.    Dehydration, Adult Dehydration is when there is not enough fluid or water in your body. This happens when you lose more fluids than you take in. Dehydration can range from mild to very bad. It should be treated right away to keep it from getting very bad. Symptoms of mild dehydration may include:  Thirst.  Dry lips.  Slightly dry mouth.  Dry, warm skin.  Dizziness. Symptoms of moderate dehydration may include:  Very dry mouth.  Muscle cramps.  Dark pee (urine). Pee may be the color of tea.  Your body making less pee.  Your eyes making fewer tears.  Heartbeat that is uneven or faster than normal (palpitations).  Headache.  Light-headedness, especially when you stand up from sitting.  Fainting (syncope). Symptoms of very bad dehydration may include:  Changes in skin, such as: ? Cold and clammy skin. ? Blotchy (mottled) or pale skin. ? Skin that does not quickly return to normal after being lightly pinched and let go (poor skin turgor).  Changes in body fluids, such as: ? Feeling very thirsty. ? Your eyes making fewer tears. ? Not sweating when body temperature is high, such as in hot weather. ? Your body making very  little pee.  Changes in vital signs, such as: ? Weak pulse. ? Pulse that is more than 100 beats a minute when you are sitting still. ? Fast breathing. ? Low blood pressure.  Other changes, such as: ? Sunken eyes. ? Cold hands and feet. ? Confusion. ? Lack of energy (lethargy). ? Trouble waking up from sleep. ? Short-term weight loss. ? Unconsciousness. Follow these instructions at home:  If told by your doctor, drink an ORS: ? Make an ORS by using instructions on the package. ? Start by drinking small amounts, about  cup (120 mL) every 5-10 minutes. ? Slowly drink more until you have had the amount that your doctor said to have.  Drink enough clear fluid to keep your pee clear or pale yellow. If you were told to drink an ORS, finish the ORS first, then start slowly drinking clear fluids. Drink fluids such as: ? Water. Do not drink only water by itself. Doing that can make the salt (sodium) level in your body get too low (hyponatremia). ? Ice chips. ? Fruit juice that you have added water to (diluted). ? Low-calorie sports drinks.  Avoid: ? Alcohol. ? Drinks that have a lot of sugar. These include high-calorie sports drinks, fruit juice that does not have water added, and soda. ? Caffeine. ? Foods that are greasy or have a lot of fat or sugar.  Take over-the-counter and prescription medicines only as told by your doctor.  Do not take salt tablets. Doing that can make the salt level in your body get too high (hypernatremia).  Eat foods that have minerals (electrolytes). Examples include bananas, oranges, potatoes, tomatoes, and spinach.  Keep all follow-up visits as told by your doctor. This is important. Contact a doctor if:  You have belly (abdominal) pain that: ? Gets worse. ? Stays in one area (localizes).  You have a rash.  You have a stiff neck.  You get angry or annoyed more easily than normal (irritability).  You are more sleepy than normal.  You have a  harder time waking up than normal.  You feel: ? Weak. ? Dizzy. ? Very thirsty.  You have peed (urinated) only a small amount of very dark pee during 6-8 hours. Get help right away if:  You have symptoms of very bad dehydration.  You cannot drink fluids without throwing up (vomiting).  Your symptoms get worse with treatment.  You have a fever.  You have a very bad headache.  You are throwing up or having watery poop (diarrhea) and it: ? Gets worse. ? Does not go away.  You have blood or something green (bile) in your throw-up.  You have blood in your poop (stool). This may cause poop to look black and tarry.  You have not peed in 6-8 hours.  You pass out (faint).  Your heart rate when you are sitting still is more than 100 beats a minute.  You have trouble breathing. This information is not intended to replace advice given to you by your health care provider. Make sure you discuss any questions you have with your health care provider. Document Released: 08/28/2009 Document Revised: 05/21/2016 Document Reviewed: 12/26/2015 Elsevier Interactive Patient Education  2018 [employer].     IF you received an x-ray today, you will receive an invoice from [facility]. Please contact [facility] at [phone] with questions or concerns regarding your invoice.   IF you received labwork today, you will receive an invoice from [place]. Please contact [facility] at [phone] with questions or concerns regarding your invoice.   Our billing staff will not be able to assist you with questions regarding bills from these companies.  You will be contacted with the lab results as soon as they are available. The fastest way to get your results is to activate your My Chart account. Instructions are located on the last page of this paperwork. If you have not heard from [place] regarding the results in 2 weeks, please contact this office.

## 2018-05-10 NOTE — Telephone Encounter (Signed)
See message  Below.  

## 2018-05-10 NOTE — Telephone Encounter (Signed)
Patient has already been evaluated by us in the office for this issue.

## 2018-05-27 ENCOUNTER — Encounter: Payer: Self-pay | Admitting: Family Medicine

## 2018-05-27 ENCOUNTER — Ambulatory Visit (INDEPENDENT_AMBULATORY_CARE_PROVIDER_SITE_OTHER): Payer: 59 | Admitting: Family Medicine

## 2018-05-27 DIAGNOSIS — Z3042 Encounter for surveillance of injectable contraceptive: Secondary | ICD-10-CM | POA: Diagnosis not present

## 2018-05-27 MED ORDER — MEDROXYPROGESTERONE ACETATE 150 MG/ML IM SUSP
150.0000 mg | Freq: Once | INTRAMUSCULAR | Status: AC
  Administered 2018-05-27: 150 mg via INTRAMUSCULAR

## 2018-05-27 NOTE — Progress Notes (Signed)
Depo shot given. Left upper outer quadrant. Next sot due Sept 28-Oct 12. Sheet given to patient to let her know when her next one is due.   Last shot given 03/04/18 .

## 2018-06-09 NOTE — Progress Notes (Signed)
Claudia Jackson  MRN: [id number] DOB: August 21, 1986  Subjective:  Pt is a 32 y.o. female who presents for annual physical exam. Pt is fasting today.   Diet: Three meals a day. Likes veggies, yogurt, meats, pastas, fruits. Started taking multivitamin recently. Drinking some water. Exercise:No structured exercise Sleep: 6-7 hrs a night.  BM: Every other day.  Menses: G1P1. Has not had cycles since using depo. She is not sexually active. Has not been since last STD testing.    Last annual exam: 2018 Last dental exam: Yearly, brushes BID Last vision exam: Cannot remember  Last pap smear: 2017, had LOW-GRADE SQUAMOUS INTRAEPITHELIAL LESION (LGSIL), went to have colposcopy, which she reports is normal. Has PMH of anemia but this is been resolved with the above injections due to double injections helping with her heavy periods.  Vaccinations      Tetanus: 2015   Patient Active Problem List   Diagnosis Date Noted  . 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 Menorrhagia with regular cycle 05/02/2014  . Cervical dysplasia, mild 05/02/2014  . Iron deficiency anemia due to chronic blood loss 05/02/2014    Current Outpatient Medications on File Prior to Visit  Medication Sig Dispense Refill  . fluticasone (FLONASE) 50 MCG/ACT nasal spray Place 2 sprays into both nostrils daily. 16 g 6  . loratadine (CLARITIN) 10 MG tablet Take 10 mg by mouth daily.     No current facility-administered medications on file prior to visit.     No Known Allergies  Social History   Socioeconomic History  . Marital status: Single    Spouse name: n/a  . Number of children: 1  . Years of education: 12+  . Highest education level: Not on file  Occupational History  . 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 Financial resource strain: Not very hard  . Food insecurity:    Worry: Never true    Inability:  Never true  . Transportation needs:    Medical: No    Non-medical: Not on file  Tobacco Use  . Smoking status: Never Smoker  . Smokeless tobacco: Never Used  Substance and Sexual Activity  . Alcohol use: Yes    Comment: socially  . Drug use: No  . Sexual activity: Not Currently    Partners: Male    Birth control/protection: Injection  Lifestyle  . Physical activity:    Days per week: 0 days    Minutes per session: 0 min  . Stress: To some extent  Relationships  . Social connections:    Talks on phone: More than three times a week    Gets together: More than three times a week    Attends religious service: More than 4 times per year    Active member of club or organization: Yes    Attends meetings of clubs or organizations: More than 4 times per year    Relationship status: Never married  Other Topics Concern  . Not on file  Social History Narrative   Marital status: single      Children: 1 son (56yo)      Lives: with parents, son      Tobacco: none      Drugs: none      Exercise: none      Seatbelt: 100%      Guns: none      Sexually:  Total  partners = 5; no STDs.  Condoms 90%    History reviewed. No pertinent surgical history.  Family History  Problem Relation Age of Onset  . Cancer Brother 13       Hodgkin's Lymphoma in remission  . Hyperlipidemia Brother   . Asthma Son   . Cancer Paternal Aunt        BRAIN  . Breast cancer Maternal Grandmother   . Cancer Maternal Grandmother        BRAIN    Review of Systems  Constitutional: Negative for activity change, appetite change, chills, diaphoresis, fatigue, fever and unexpected weight change.  HENT: Negative for congestion, dental problem, drooling, ear discharge, ear pain, facial swelling, hearing loss, mouth sores, nosebleeds, postnasal drip, rhinorrhea, sinus pressure, sinus pain, sneezing, sore throat, tinnitus, trouble swallowing and voice change.   Eyes: Negative for photophobia, pain, discharge, redness,  itching and visual disturbance.  Respiratory: Negative for apnea, cough, choking, chest tightness, shortness of breath, wheezing and stridor.   Cardiovascular: Negative for chest pain, palpitations and leg swelling.  Gastrointestinal: Negative for abdominal distention, abdominal pain, anal bleeding, blood in stool, constipation, diarrhea, nausea, rectal pain and vomiting.  Endocrine: Negative for cold intolerance, heat intolerance, polydipsia, polyphagia and polyuria.  Genitourinary: Negative for decreased urine volume, difficulty urinating, dyspareunia, dysuria, enuresis, flank pain, frequency, genital sores, hematuria, menstrual problem, pelvic pain, urgency, vaginal bleeding, vaginal discharge and vaginal pain.  Musculoskeletal: Negative for arthralgias, back pain, gait problem, joint swelling, myalgias, neck pain and neck stiffness.  Skin: Negative for color change, pallor, rash and wound.  Allergic/Immunologic: Negative for environmental allergies, food allergies and immunocompromised state.  Neurological: Negative for dizziness, tremors, seizures, syncope, facial asymmetry, speech difficulty, weakness, light-headedness, numbness and headaches.  Hematological: Negative for adenopathy. Does not bruise/bleed easily.  Psychiatric/Behavioral: Negative for agitation, behavioral problems, confusion, decreased concentration, dysphoric mood, hallucinations, self-injury, sleep disturbance and suicidal ideas. The patient is not nervous/anxious and is not hyperactive.     Objective:  BP 108/74   Pulse 87   Temp 98.6 F (37 C) (Oral)   Ht 5' 3"  (1.6 m)   Wt 127 lb 12.8 oz (58 kg)   SpO2 99%   BMI 22.64 kg/m   Physical Exam  Constitutional: She is oriented to person, place, and time. She appears well-developed and well-nourished. No distress.  HENT:  Head: Normocephalic and atraumatic.  Right Ear: Hearing, tympanic membrane, external ear and ear canal normal.  Left Ear: Hearing, tympanic  membrane, external ear and ear canal normal.  Nose: Nose normal.  Mouth/Throat: Uvula is midline, oropharynx is clear and moist and mucous membranes are normal. No oropharyngeal exudate.  Eyes: Pupils are equal, round, and reactive to light. Conjunctivae, EOM and lids are normal. No scleral icterus.  Neck: Trachea normal and normal range of motion. No thyroid mass and no thyromegaly present.  Cardiovascular: Normal rate, regular rhythm, normal heart sounds and intact distal pulses.  Pulmonary/Chest: Effort normal and breath sounds normal. Right breast exhibits no inverted nipple, no mass, no nipple discharge, no skin change and no tenderness. Left breast exhibits no inverted nipple, no mass, no nipple discharge, no skin change and no tenderness.  Bilateral nipple piercing noted.   Abdominal: Soft. Normal appearance and bowel sounds are normal. There is no tenderness.  Genitourinary: Vagina normal and uterus normal. Uterus is not deviated, not enlarged, not fixed and not tender. Cervix exhibits no motion tenderness, no discharge and no friability. Right adnexum displays no mass, no tenderness  and no fullness. Left adnexum displays no mass, no tenderness and no fullness.  Genitourinary Comments: CMA chaperone present for GU and breast exam.  Lymphadenopathy:       Head (right side): No tonsillar, no preauricular, no posterior auricular and no occipital adenopathy present.       Head (left side): No tonsillar, no preauricular, no posterior auricular and no occipital adenopathy present.    She has no cervical adenopathy.       Right: No supraclavicular adenopathy present.       Left: No supraclavicular adenopathy present.  Neurological: She is alert and oriented to person, place, and time. She has normal strength and normal reflexes.  Skin: Skin is warm and dry.    Visual Acuity Screening   Right eye Left eye Both eyes  Without correction: 20/13 20/13 20/13   With correction:       Wt Readings  from Last 3 Encounters:  06/10/18 127 lb 12.8 oz (58 kg)  05/09/18 126 lb (57.2 kg)  09/15/17 118 lb (53.5 kg)     Assessment and Plan :  Discussed healthy lifestyle, diet, exercise, preventative care, vaccinations, and addressed patient's concerns. Plan for follow up in one year. Otherwise, plan for specific conditions below.  1. Annual physical exam Await lab results.   2. Screening for metabolic disorder - BHG79+VPGD  3. Screening cholesterol level - Lipid panel  4. Screening for hematuria or proteinuria - Urinalysis, dipstick only  5. Screening for cervical cancer - Pap IG and HPV (high risk) DNA detection  Tenna Delaine, PA-C  [facility] 06/10/2018 11:31 AM

## 2018-06-10 ENCOUNTER — Ambulatory Visit (INDEPENDENT_AMBULATORY_CARE_PROVIDER_SITE_OTHER): Payer: 59 | Admitting: Physician Assistant

## 2018-06-10 ENCOUNTER — Encounter: Payer: Self-pay | Admitting: Physician Assistant

## 2018-06-10 ENCOUNTER — Other Ambulatory Visit: Payer: Self-pay

## 2018-06-10 VITALS — BP 108/74 | HR 87 | Temp 98.6°F | Ht 63.0 in | Wt 127.8 lb

## 2018-06-10 DIAGNOSIS — Z13220 Encounter for screening for lipoid disorders: Secondary | ICD-10-CM

## 2018-06-10 DIAGNOSIS — Z13228 Encounter for screening for other metabolic disorders: Secondary | ICD-10-CM

## 2018-06-10 DIAGNOSIS — Z124 Encounter for screening for malignant neoplasm of cervix: Secondary | ICD-10-CM

## 2018-06-10 DIAGNOSIS — Z1389 Encounter for screening for other disorder: Secondary | ICD-10-CM

## 2018-06-10 DIAGNOSIS — Z0000 Encounter for general adult medical examination without abnormal findings: Secondary | ICD-10-CM | POA: Diagnosis not present

## 2018-06-10 NOTE — Patient Instructions (Addendum)
Health Maintenance, Female Adopting a healthy lifestyle and getting preventive care can go a long way to promote health and wellness. Talk with your health care provider about what schedule of regular examinations is right for you. This is a good chance for you to check in with your provider about disease prevention and staying healthy. In between checkups, there are plenty of things you can do on your own. Experts have done a lot of research about which lifestyle changes and preventive measures are most likely to keep you healthy. Ask your health care provider for more information. Weight and diet Eat a healthy diet  Be sure to include plenty of vegetables, fruits, low-fat dairy products, and lean protein.  Do not eat a lot of foods high in solid fats, added sugars, or salt.  Get regular exercise. This is one of the most important things you can do for your health. ? Most adults should exercise for at least 150 minutes each week. The exercise should increase your heart rate and make you sweat (moderate-intensity exercise). ? Most adults should also do strengthening exercises at least twice a week. This is in addition to the moderate-intensity exercise.  Maintain a healthy weight  Body mass index (BMI) is a measurement that can be used to identify possible weight problems. It estimates body fat based on height and weight. Your health care provider can help determine your BMI and help you achieve or maintain a healthy weight.  For females 46 years of age and older: ? A BMI below 18.5 is considered underweight. ? A BMI of 18.5 to 24.9 is normal. ? A BMI of 25 to 29.9 is considered overweight. ? A BMI of 30 and above is considered obese.  Watch levels of cholesterol and blood lipids  You should start having your blood tested for lipids and cholesterol at 34 years of age, then have this test every 5 years.  You may need to have your cholesterol levels checked more often if: ? Your lipid  or cholesterol levels are high. ? You are older than 34 years of age. ? You are at high risk for heart disease.  Cancer screening Lung Cancer  Lung cancer screening is recommended for adults 79-62 years old who are at high risk for lung cancer because of a history of smoking.  A yearly low-dose CT scan of the lungs is recommended for people who: ? Currently smoke. ? Have quit within the past 15 years. ? Have at least a 30-pack-year history of smoking. A pack year is smoking an average of one pack of cigarettes a day for 1 year.  Yearly screening should continue until it has been 15 years since you quit.  Yearly screening should stop if you develop a health problem that would prevent you from having lung cancer treatment.  Breast Cancer  Practice breast self-awareness. This means understanding how your breasts normally appear and feel.  It also means doing regular breast self-exams. Let your health care provider know about any changes, no matter how small.  If you are in your 20s or 30s, you should have a clinical breast exam (CBE) by a health care provider every 1-3 years as part of a regular health exam.  If you are 59 or older, have a CBE every year. Also consider having a breast X-ray (mammogram) every year.  If you have a family history of breast cancer, talk to your health care provider about genetic screening.  If you are at high  risk for breast cancer, talk to your health care provider about having an MRI and a mammogram every year.  Breast cancer gene (BRCA) assessment is recommended for women who have family members with BRCA-related cancers. BRCA-related cancers include: ? Breast. ? Ovarian. ? Tubal. ? Peritoneal cancers.  Results of the assessment will determine the need for genetic counseling and BRCA1 and BRCA2 testing.  Cervical Cancer Your health care provider may recommend that you be screened regularly for cancer of the pelvic organs (ovaries, uterus, and  vagina). This screening involves a pelvic examination, including checking for microscopic changes to the surface of your cervix (Pap test). You may be encouraged to have this screening done every 3 years, beginning at age 21.  For women ages 30-65, health care providers may recommend pelvic exams and Pap testing every 3 years, or they may recommend the Pap and pelvic exam, combined with testing for human papilloma virus (HPV), every 5 years. Some types of HPV increase your risk of cervical cancer. Testing for HPV may also be done on women of any age with unclear Pap test results.  Other health care providers may not recommend any screening for nonpregnant women who are considered low risk for pelvic cancer and who do not have symptoms. Ask your health care provider if a screening pelvic exam is right for you.  If you have had past treatment for cervical cancer or a condition that could lead to cancer, you need Pap tests and screening for cancer for at least 20 years after your treatment. If Pap tests have been discontinued, your risk factors (such as having a new sexual partner) need to be reassessed to determine if screening should resume. Some women have medical problems that increase the chance of getting cervical cancer. In these cases, your health care provider may recommend more frequent screening and Pap tests.  Colorectal Cancer  This type of cancer can be detected and often prevented.  Routine colorectal cancer screening usually begins at 34 years of age and continues through 34 years of age.  Your health care provider may recommend screening at an earlier age if you have risk factors for colon cancer.  Your health care provider may also recommend using home test kits to check for hidden blood in the stool.  A small camera at the end of a tube can be used to examine your colon directly (sigmoidoscopy or colonoscopy). This is done to check for the earliest forms of colorectal  cancer.  Routine screening usually begins at age 50.  Direct examination of the colon should be repeated every 5-10 years through 34 years of age. However, you may need to be screened more often if early forms of precancerous polyps or small growths are found.  Skin Cancer  Check your skin from head to toe regularly.  Tell your health care provider about any new moles or changes in moles, especially if there is a change in a mole's shape or color.  Also tell your health care provider if you have a mole that is larger than the size of a pencil eraser.  Always use sunscreen. Apply sunscreen liberally and repeatedly throughout the day.  Protect yourself by wearing long sleeves, pants, a wide-brimmed hat, and sunglasses whenever you are outside.  Heart disease, diabetes, and high blood pressure  High blood pressure causes heart disease and increases the risk of stroke. High blood pressure is more likely to develop in: ? People who have blood pressure in the high end   of the normal range (130-139/85-89 mm Hg). ? People who are overweight or obese. ? People who are African American.  If you are 18-39 years of age, have your blood pressure checked every 3-5 years. If you are 40 years of age or older, have your blood pressure checked every year. You should have your blood pressure measured twice-once when you are at a hospital or clinic, and once when you are not at a hospital or clinic. Record the average of the two measurements. To check your blood pressure when you are not at a hospital or clinic, you can use: ? An automated blood pressure machine at a pharmacy. ? A home blood pressure monitor.  If you are between 55 years and 79 years old, ask your health care provider if you should take aspirin to prevent strokes.  Have regular diabetes screenings. This involves taking a blood sample to check your fasting blood sugar level. ? If you are at a normal weight and have a low risk for diabetes,  have this test once every three years after 34 years of age. ? If you are overweight and have a high risk for diabetes, consider being tested at a younger age or more often. Preventing infection Hepatitis B  If you have a higher risk for hepatitis B, you should be screened for this virus. You are considered at high risk for hepatitis B if: ? You were born in a country where hepatitis B is common. Ask your health care provider which countries are considered high risk. ? Your parents were born in a high-risk country, and you have not been immunized against hepatitis B (hepatitis B vaccine). ? You have HIV or AIDS. ? You use needles to inject street drugs. ? You live with someone who has hepatitis B. ? You have had sex with someone who has hepatitis B. ? You get hemodialysis treatment. ? You take certain medicines for conditions, including cancer, organ transplantation, and autoimmune conditions.  Hepatitis C  Blood testing is recommended for: ? Everyone born from 1945 through 1965. ? Anyone with known risk factors for hepatitis C.  Sexually transmitted infections (STIs)  You should be screened for sexually transmitted infections (STIs) including gonorrhea and chlamydia if: ? You are sexually active and are younger than 34 years of age. ? You are older than 34 years of age and your health care provider tells you that you are at risk for this type of infection. ? Your sexual activity has changed since you were last screened and you are at an increased risk for chlamydia or gonorrhea. Ask your health care provider if you are at risk.  If you do not have HIV, but are at risk, it may be recommended that you take a prescription medicine daily to prevent HIV infection. This is called pre-exposure prophylaxis (PrEP). You are considered at risk if: ? You are sexually active and do not regularly use condoms or know the HIV status of your partner(s). ? You take drugs by injection. ? You are  sexually active with a partner who has HIV.  Talk with your health care provider about whether you are at high risk of being infected with HIV. If you choose to begin PrEP, you should first be tested for HIV. You should then be tested every 3 months for as long as you are taking PrEP. Pregnancy  If you are premenopausal and you may become pregnant, ask your health care provider about preconception counseling.  If you may   become pregnant, take 400 to 800 micrograms (mcg) of folic acid every day.  If you want to prevent pregnancy, talk to your health care provider about birth control (contraception). Osteoporosis and menopause  Osteoporosis is a disease in which the bones lose minerals and strength with aging. This can result in serious bone fractures. Your risk for osteoporosis can be identified using a bone density scan.  If you are 43 years of age or older, or if you are at risk for osteoporosis and fractures, ask your health care provider if you should be screened.  Ask your health care provider whether you should take a calcium or vitamin D supplement to lower your risk for osteoporosis.  Menopause may have certain physical symptoms and risks.  Hormone replacement therapy may reduce some of these symptoms and risks. Talk to your health care provider about whether hormone replacement therapy is right for you. Follow these instructions at home:  Schedule regular health, dental, and eye exams.  Stay current with your immunizations.  Do not use any tobacco products including cigarettes, chewing tobacco, or electronic cigarettes.  If you are pregnant, do not drink alcohol.  If you are breastfeeding, limit how much and how often you drink alcohol.  Limit alcohol intake to no more than 1 drink per day for nonpregnant women. One drink equals 12 ounces of beer, 5 ounces of wine, or 1 ounces of hard liquor.  Do not use street drugs.  Do not share needles.  Ask your health care  provider for help if you need support or information about quitting drugs.  Tell your health care provider if you often feel depressed.  Tell your health care provider if you have ever been abused or do not feel safe at home. This information is not intended to replace advice given to you by your health care provider. Make sure you discuss any questions you have with your health care provider. Document Released: 05/17/2011 Document Revised: 04/08/2016 Document Reviewed: 08/05/2015 Elsevier Interactive Patient Education  2018 [employer].   IF you received an x-ray today, you will receive an invoice from [facility]. Please contact [facility] at [phone] with questions or concerns regarding your invoice.   IF you received labwork today, you will receive an invoice from [facility]. Please contact [facility] at [phone] with questions or concerns regarding your invoice.   Our billing staff will not be able to assist you with questions regarding bills from these companies.  You will be contacted with the lab results as soon as they are available. The fastest way to get your results is to activate your My Chart account. Instructions are located on the last page of this paperwork. If you have not heard from [place] regarding the results in 2 weeks, please contact this office.

## 2018-06-11 LAB — CMP14+EGFR
ALT: 14 IU/L (ref 0–32)
AST: 16 IU/L (ref 0–40)
Albumin/Globulin Ratio: 1.5 (ref 1.2–2.2)
Albumin: 4.6 g/dL (ref 3.5–5.5)
Alkaline Phosphatase: 89 IU/L (ref 39–117)
BUN / CREAT RATIO: 9 (ref 9–23)
BUN: 8 mg/dL (ref 6–20)
Bilirubin Total: 0.6 mg/dL (ref 0.0–1.2)
CALCIUM: 9.8 mg/dL (ref 8.7–10.2)
CHLORIDE: 107 mmol/L — AB (ref 96–106)
CO2: 20 mmol/L (ref 20–29)
CREATININE: 0.89 mg/dL (ref 0.57–1.00)
GFR, EST AFRICAN AMERICAN: 98 mL/min/{1.73_m2} (ref >59)
GFR, EST NON AFRICAN AMERICAN: 85 mL/min/{1.73_m2} (ref >59)
GLOBULIN, TOTAL: 3 g/dL (ref 1.5–4.5)
Glucose: 76 mg/dL (ref 65–99)
Potassium: 4.6 mmol/L (ref 3.5–5.2)
Sodium: 143 mmol/L (ref 134–144)
TOTAL PROTEIN: 7.6 g/dL (ref 6.0–8.5)

## 2018-06-11 LAB — URINALYSIS, DIPSTICK ONLY
BILIRUBIN UA: NEGATIVE
GLUCOSE, UA: NEGATIVE
KETONES UA: NEGATIVE
LEUKOCYTES UA: NEGATIVE
Nitrite, UA: NEGATIVE
PROTEIN UA: NEGATIVE
RBC UA: NEGATIVE
SPEC GRAV UA: 1.02 (ref 1.005–1.030)
Urobilinogen, Ur: 0.2 mg/dL (ref 0.2–1.0)
pH, UA: 6.5 (ref 5.0–7.5)

## 2018-06-11 LAB — LIPID PANEL
CHOL/HDL RATIO: 4.6 ratio — AB (ref 0.0–4.4)
Cholesterol, Total: 157 mg/dL (ref 100–199)
HDL: 34 mg/dL — AB (ref >39)
LDL CALC: 95 mg/dL (ref 0–99)
Triglycerides: 142 mg/dL (ref 0–149)
VLDL CHOLESTEROL CAL: 28 mg/dL (ref 5–40)

## 2018-06-14 ENCOUNTER — Encounter: Payer: Self-pay | Admitting: Physician Assistant

## 2018-06-15 LAB — PAP IG AND HPV HIGH-RISK
HPV, HIGH-RISK: POSITIVE — AB
PAP Smear Comment: 0

## 2018-08-19 ENCOUNTER — Ambulatory Visit (INDEPENDENT_AMBULATORY_CARE_PROVIDER_SITE_OTHER): Payer: 59 | Admitting: Family Medicine

## 2018-08-19 DIAGNOSIS — Z23 Encounter for immunization: Secondary | ICD-10-CM | POA: Diagnosis not present

## 2018-08-19 DIAGNOSIS — Z3049 Encounter for surveillance of other contraceptives: Secondary | ICD-10-CM | POA: Diagnosis not present

## 2018-08-19 MED ORDER — MEDROXYPROGESTERONE ACETATE 150 MG/ML IM SUSP
150.0000 mg | Freq: Once | INTRAMUSCULAR | Status: AC
Start: 2018-08-19 — End: 2018-08-19
  Administered 2018-08-19: 150 mg via INTRAMUSCULAR

## 2018-08-19 NOTE — Progress Notes (Signed)
Patient is due dec21-jan 4 for next depo shot

## 2018-09-23 ENCOUNTER — Encounter: Payer: Self-pay | Admitting: Physician Assistant

## 2018-11-13 ENCOUNTER — Encounter: Payer: Self-pay | Admitting: Physician Assistant

## 2018-11-13 ENCOUNTER — Ambulatory Visit (INDEPENDENT_AMBULATORY_CARE_PROVIDER_SITE_OTHER): Payer: 59 | Admitting: Family Medicine

## 2018-11-13 DIAGNOSIS — Z3049 Encounter for surveillance of other contraceptives: Secondary | ICD-10-CM | POA: Diagnosis not present

## 2018-11-13 DIAGNOSIS — Z23 Encounter for immunization: Secondary | ICD-10-CM

## 2018-11-13 MED ORDER — MEDROXYPROGESTERONE ACETATE 150 MG/ML IM SUSP
150.0000 mg | Freq: Once | INTRAMUSCULAR | Status: AC
  Administered 2018-11-13: 150 mg via INTRAMUSCULAR

## 2018-11-13 NOTE — Progress Notes (Signed)
Next shot due 3/17-3/31/20

## 2018-11-30 DIAGNOSIS — L639 Alopecia areata, unspecified: Secondary | ICD-10-CM | POA: Diagnosis not present

## 2018-11-30 DIAGNOSIS — L819 Disorder of pigmentation, unspecified: Secondary | ICD-10-CM | POA: Diagnosis not present

## 2019-02-03 ENCOUNTER — Other Ambulatory Visit: Payer: Self-pay

## 2019-02-03 ENCOUNTER — Ambulatory Visit (INDEPENDENT_AMBULATORY_CARE_PROVIDER_SITE_OTHER): Payer: 59 | Admitting: Family Medicine

## 2019-02-03 DIAGNOSIS — Z3049 Encounter for surveillance of other contraceptives: Secondary | ICD-10-CM

## 2019-02-03 MED ORDER — MEDROXYPROGESTERONE ACETATE 150 MG/ML IM SUSP
150.0000 mg | Freq: Once | INTRAMUSCULAR | Status: AC
  Administered 2019-02-03: 150 mg via INTRAMUSCULAR

## 2019-02-03 NOTE — Patient Instructions (Signed)
Next window is June 6th to June 20th

## 2019-02-03 NOTE — Progress Notes (Signed)
Depo shot in window

## 2019-04-27 ENCOUNTER — Ambulatory Visit (INDEPENDENT_AMBULATORY_CARE_PROVIDER_SITE_OTHER): Payer: 59 | Admitting: Family Medicine

## 2019-04-27 ENCOUNTER — Other Ambulatory Visit: Payer: Self-pay

## 2019-04-27 DIAGNOSIS — Z3042 Encounter for surveillance of injectable contraceptive: Secondary | ICD-10-CM

## 2019-04-27 DIAGNOSIS — Z3049 Encounter for surveillance of other contraceptives: Secondary | ICD-10-CM

## 2019-04-27 MED ORDER — MEDROXYPROGESTERONE ACETATE 150 MG/ML IM SUSP
150.0000 mg | Freq: Once | INTRAMUSCULAR | Status: AC
  Administered 2019-04-27: 150 mg via INTRAMUSCULAR

## 2019-04-27 NOTE — Progress Notes (Signed)
Patient here for her scheduled Depo Shot.  She is within her window.    Patient denied having any fever or being exposed to anyone with a positive COVID 19 test.  She denies any allergies.  Patient received the injection in her upper left quadrant.  She tolerated the injection well with no immediate adverse effects.  She was observed briefly then discharged.   Patient will return between July 13, 2019 to July 27, 2019 for her next scheduled Depo Provera injection.

## 2019-06-06 ENCOUNTER — Encounter: Payer: Self-pay | Admitting: Emergency Medicine

## 2019-06-06 ENCOUNTER — Other Ambulatory Visit: Payer: Self-pay

## 2019-06-06 ENCOUNTER — Ambulatory Visit
Admission: EM | Admit: 2019-06-06 | Discharge: 2019-06-06 | Disposition: A | Discharge status: Home / Self Care | Payer: 59

## 2019-06-06 DIAGNOSIS — R509 Fever, unspecified: Secondary | ICD-10-CM

## 2019-06-06 NOTE — Discharge Instructions (Signed)
I do recommend covid testing to ensure negative before return to work.  I recommend isolation until testing results.  Will notify you of any positive findings. You may monitor your results on your MyChart online as well.

## 2019-06-06 NOTE — ED Triage Notes (Signed)
Pt presents to [facility] for assessment after having 100.4 temp when checking in to work this morning.  99.7 oral at triage.  Denies any symptoms

## 2019-06-06 NOTE — ED Notes (Signed)
Patient able to ambulate independently  

## 2019-06-06 NOTE — ED Provider Notes (Signed)
[facility]    CSN: [id number] Arrival date & time: 06/06/19  1535     History   Chief Complaint Chief Complaint  Patient presents with  . Fever    HPI Claudia Jackson is a 33 y.o. female.   Claudia Jackson presents with complaints of temp of 100.4. she returned from her lunch break, she had been in her car, she had her temperature screen on re-entry and after 4 reads it remained in the 100's. She feels well. No cough, no congestion, no headache, no body aches, no sore throat, no loss of taste or smell, no gi symptoms. No specific known ill contacts. Her work stated that she needs to be evaluated to receive clearance to return. Hx of allergies, anemia.     ROS per HPI, negative if not otherwise mentioned.      Past Medical History:  Diagnosis Date  . Allergy   . Anemia     Patient Active Problem List   Diagnosis Date Noted  . 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 Menorrhagia with regular cycle 05/02/2014  . Cervical dysplasia, mild 05/02/2014  . Iron deficiency anemia due to chronic blood loss 05/02/2014    History reviewed. No pertinent surgical history.  OB History    Gravida  1   Para  1   Term      Preterm      AB      Living  1     SAB      TAB      Ectopic      Multiple      Live Births               Home Medications    Prior to Admission medications   Medication Sig Start Date End Date Taking? Authorizing Provider  Multiple Vitamin (MULTIVITAMIN) LIQD Take 5 mLs by mouth daily.   Yes [provider]  fluticasone (FLONASE) 50 MCG/ACT nasal spray Place 2 sprays into both nostrils daily. 01/14/17   Forrest Moron, MD  loratadine (CLARITIN) 10 MG tablet Take 10 mg by mouth daily.    [provider]    Family History Family History  Problem Relation Age of Onset  . Cancer Brother 13       Hodgkin's Lymphoma in remission  . Hyperlipidemia Brother   . Asthma  Son   . Cancer Paternal Aunt        BRAIN  . Breast cancer Maternal Grandmother   . Cancer Maternal Grandmother        BRAIN    Social History Social History   Tobacco Use  . Smoking status: Never Smoker  . Smokeless tobacco: Never Used  Substance Use Topics  . Alcohol use: Yes    Comment: socially  . Drug use: No     Allergies   Patient has no known allergies.   Review of Systems Review of Systems   Physical Exam Triage Vital Signs ED Triage Vitals  Enc Vitals Group     BP 06/06/19 1546 117/73     Pulse Rate 06/06/19 1546 (!) 104     Resp 06/06/19 1546 18     Temp 06/06/19 1546 99.7 F (37.6 C)     Temp Source 06/06/19 1546 Oral     SpO2 06/06/19 1546 98 %     Weight --      Height --      Head Circumference --  Peak Flow --      Pain Score 06/06/19 1547 0     Pain Loc --      Pain Edu? --      Excl. in GC? --    No data found.  Updated Vital Signs BP 117/73 (BP Location: Left Arm)   Pulse (!) 104   Temp 99.7 F (37.6 C) (Oral)   Resp 18   SpO2 98%   Visual Acuity Right Eye Distance:   Left Eye Distance:   Bilateral Distance:    Right Eye Near:   Left Eye Near:    Bilateral Near:     Physical Exam Constitutional:      General: She is not in acute distress.    Appearance: She is well-developed.  Cardiovascular:     Rate and Rhythm: Tachycardia present.  Pulmonary:     Effort: Pulmonary effort is normal. No respiratory distress.  Skin:    General: Skin is warm and dry.  Neurological:     Mental Status: She is alert and oriented to person, place, and time.  Psychiatric:     Comments: Patient tearful due to loss of work from testing process       UC Treatments / Results  Labs (all labs ordered are listed, but only abnormal results are displayed) Labs Reviewed  NOVEL CORONAVIRUS, NAA    EKG   Radiology No results found.  Procedures Procedures (including critical care time)  Medications Ordered in UC Medications -  No data to display  Initial Impression / Assessment and Plan / UC Course  I have reviewed the triage vital signs and the nursing notes.  Pertinent labs & imaging results that were available during my care of the patient were reviewed by me and considered in my medical decision making (see chart for details).     Low grade temperatures while at work today, here with oral temp of 99.7 with mild tachycardia (may be anxiety related as well as patient is upset about this situation.) Discussed with patient that unable to confirm that she does not have covid without testing, especially with new onset of low grade temperatures. Encouraged isolation until results return. Patient verbalized understanding and agreeable to plan.   Final Clinical Impressions(s) / UC Diagnoses   Final diagnoses:  Low grade fever     Discharge Instructions     I do recommend covid testing to ensure negative before return to work.  I recommend isolation until testing results.  Will notify you of any positive findings. You may monitor your results on your MyChart online as well.        ED Prescriptions    None     Controlled Substance Prescriptions [place] Controlled Substance Registry consulted? Not Applicable   Georgetta HaberBurky, Hilario Robarts B, NP 06/06/19 1615

## 2019-06-10 ENCOUNTER — Encounter (HOSPITAL_COMMUNITY): Payer: Self-pay

## 2019-06-10 LAB — NOVEL CORONAVIRUS, NAA: SARS-CoV-2, NAA: NOT DETECTED

## 2019-07-24 ENCOUNTER — Ambulatory Visit (INDEPENDENT_AMBULATORY_CARE_PROVIDER_SITE_OTHER): Payer: 59 | Admitting: Family Medicine

## 2019-07-24 ENCOUNTER — Other Ambulatory Visit: Payer: Self-pay

## 2019-07-24 DIAGNOSIS — Z3049 Encounter for surveillance of other contraceptives: Secondary | ICD-10-CM

## 2019-07-24 MED ORDER — MEDROXYPROGESTERONE ACETATE 150 MG/ML IM SUSP
150.0000 mg | Freq: Once | INTRAMUSCULAR | Status: AC
  Administered 2019-07-24: 13:00:00 150 mg via INTRAMUSCULAR

## 2019-08-20 ENCOUNTER — Other Ambulatory Visit: Payer: Self-pay

## 2019-08-20 ENCOUNTER — Other Ambulatory Visit (HOSPITAL_COMMUNITY)
Admission: RE | Admit: 2019-08-20 | Discharge: 2019-08-20 | Disposition: A | Discharge status: Home / Self Care | Payer: 59 | Source: Ambulatory Visit | Attending: Family Medicine | Admitting: Family Medicine

## 2019-08-20 ENCOUNTER — Ambulatory Visit: Payer: 59 | Admitting: Family Medicine

## 2019-08-20 ENCOUNTER — Encounter: Payer: Self-pay | Admitting: Family Medicine

## 2019-08-20 VITALS — BP 121/83 | HR 92 | Temp 99.0°F | Resp 17 | Ht 63.0 in | Wt 142.2 lb

## 2019-08-20 DIAGNOSIS — Z0000 Encounter for general adult medical examination without abnormal findings: Secondary | ICD-10-CM

## 2019-08-20 DIAGNOSIS — D500 Iron deficiency anemia secondary to blood loss (chronic): Secondary | ICD-10-CM

## 2019-08-20 DIAGNOSIS — B977 Papillomavirus as the cause of diseases classified elsewhere: Secondary | ICD-10-CM

## 2019-08-20 DIAGNOSIS — Z13228 Encounter for screening for other metabolic disorders: Secondary | ICD-10-CM

## 2019-08-20 DIAGNOSIS — Z13220 Encounter for screening for lipoid disorders: Secondary | ICD-10-CM | POA: Diagnosis not present

## 2019-08-20 DIAGNOSIS — Z23 Encounter for immunization: Secondary | ICD-10-CM

## 2019-08-20 DIAGNOSIS — Z0001 Encounter for general adult medical examination with abnormal findings: Secondary | ICD-10-CM

## 2019-08-20 DIAGNOSIS — Z131 Encounter for screening for diabetes mellitus: Secondary | ICD-10-CM | POA: Diagnosis not present

## 2019-08-20 DIAGNOSIS — Z124 Encounter for screening for malignant neoplasm of cervix: Secondary | ICD-10-CM

## 2019-08-20 DIAGNOSIS — R6882 Decreased libido: Secondary | ICD-10-CM

## 2019-08-20 LAB — POCT URINALYSIS DIP (MANUAL ENTRY)
Bilirubin, UA: NEGATIVE
Glucose, UA: NEGATIVE mg/dL
Ketones, POC UA: NEGATIVE mg/dL
Nitrite, UA: NEGATIVE
Protein Ur, POC: NEGATIVE mg/dL
Spec Grav, UA: 1.025 (ref 1.010–1.025)
Urobilinogen, UA: 0.2 E.U./dL
pH, UA: 5.5 (ref 5.0–8.0)

## 2019-08-20 NOTE — Patient Instructions (Signed)
° ° ° °  If you have lab work done today you will be contacted with your lab results within the next 2 weeks.  If you have not heard from us then please contact us. The fastest way to get your results is to register for My Chart. ° ° °IF you received an x-ray today, you will receive an invoice from [facility]. Please contact [facility] at [phone] with questions or concerns regarding your invoice.  ° °IF you received labwork today, you will receive an invoice from [facility]. Please contact [facility] at [phone] with questions or concerns regarding your invoice.  ° °Our billing staff will not be able to assist you with questions regarding bills from these companies. ° °You will be contacted with the lab results as soon as they are available. The fastest way to get your results is to activate your My Chart account. Instructions are located on the last page of this paperwork. If you have not heard from us regarding the results in 2 weeks, please contact this office. °  ° ° ° °

## 2019-08-20 NOTE — Progress Notes (Signed)
Chief Complaint  Patient presents with  . Establish Care  . Annual Exam    cpe with pap.  Has question about depo side effects and flu vaccine    Subjective:  Claudia Jackson is a 33 y.o. female here for a health maintenance visit.  Patient is established pt  Patient reports that she is having decreased sex drive and noticed weight gain Started taking medication a year ago She states that she has gained weight and has very low sex drive   Wt Readings from Last 3 Encounters:  08/20/19 142 lb 3.2 oz (64.5 kg)  06/10/18 127 lb 12.8 oz (58 kg)  05/09/18 126 lb (57.2 kg)      Patient Active Problem List   Diagnosis Date Noted  . 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 Menorrhagia with regular cycle 05/02/2014  . Cervical dysplasia, mild 05/02/2014  . Iron deficiency anemia due to chronic blood loss 05/02/2014    Past Medical History:  Diagnosis Date  . Allergy   . Anemia     No past surgical history on file.   Outpatient Medications Prior to Visit  Medication Sig Dispense Refill  . fluticasone (FLONASE) 50 MCG/ACT nasal spray Place 2 sprays into both nostrils daily. 16 g 6  . loratadine (CLARITIN) 10 MG tablet Take 10 mg by mouth daily.    . Multiple Vitamin (MULTIVITAMIN) LIQD Take 5 mLs by mouth daily.     No facility-administered medications prior to visit.     No Known Allergies   Family History  Problem Relation Age of Onset  . Cancer Brother 13       Hodgkin's Lymphoma in remission  . Hyperlipidemia Brother   . Asthma Son   . Cancer Paternal Aunt        BRAIN  . Breast cancer Maternal Grandmother   . Cancer Maternal Grandmother        BRAIN     Health Habits: Dental Exam: up to date Eye Exam: up to date Exercise: 0 times/week on average Current exercise activities: none Diet: balanced  Social History   Socioeconomic History  . Marital status: Single    Spouse name: n/a  . Number of children: 1  . Years of  education: 12+  . Highest education level: Not on file  Occupational History  . 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 Financial resource strain: Not very hard  . Food insecurity    Worry: Never true    Inability: Never true  . Transportation needs    Medical: No    Non-medical: Not on file  Tobacco Use  . Smoking status: Never Smoker  . Smokeless tobacco: Never Used  Substance and Sexual Activity  . Alcohol use: Yes    Comment: socially  . Drug use: No  . Sexual activity: Not Currently    Partners: Male    Birth control/protection: Injection  Lifestyle  . Physical activity    Days per week: 0 days    Minutes per session: 0 min  . Stress: To some extent  Relationships  . Social connections    Talks on phone: More than three times a week    Gets together: More than three times a week    Attends religious service: More than 4 times per year    Active member of club or organization: Yes    Attends meetings  of clubs or organizations: More than 4 times per year    Relationship status: Never married  . Intimate partner violence    Fear of current or ex partner: Patient refused    Emotionally abused: Patient refused    Physically abused: Patient refused    Forced sexual activity: Patient refused  Other Topics Concern  . Not on file  Social History Narrative   Marital status: single      Children: 1 son (79yo)      Lives: with parents, son      Tobacco: none      Drugs: none      Exercise: none      Seatbelt: 100%      Guns: none      Sexually:  Total partners = 5; no STDs.  Condoms 90%   Social History   Substance and Sexual Activity  Alcohol Use Yes   Comment: socially   Social History   Tobacco Use  Smoking Status Never Smoker  Smokeless Tobacco Never Used   Social History   Substance and Sexual Activity  Drug Use No    GYN: Sexual Health Menstrual status: regular menses LMP:  Patient's last menstrual period was 08/26/2018. Last pap smear: see HM section History of abnormal pap smears:  Sexually active: with female partner Current contraception: DEPO  Health Maintenance: See under health Maintenance activity for review of completion dates as well. Immunization History  Administered Date(s) Administered  . Influenza,inj,Quad PF,6+ Mos 07/19/2014, 08/19/2018  . Tdap 03/17/2014      Depression Screen-PHQ2/9 Depression screen [facility] 2/9 08/20/2019 06/10/2018 05/09/2018 02/18/2017 01/14/2017  Decreased Interest 0 0 0 0 0  Down, Depressed, Hopeless 0 0 0 0 0  PHQ - 2 Score 0 0 0 0 0       Depression Severity and Treatment Recommendations:  0-4= None  5-9= Mild / Treatment: Support, educate to call if worse; return in one month  10-14= Moderate / Treatment: Support, watchful waiting; Antidepressant or Psycotherapy  15-19= Moderately severe / Treatment: Antidepressant OR Psychotherapy  >= 20 = Major depression, severe / Antidepressant AND Psychotherapy    Review of Systems   Review of Systems  Constitutional: Negative for chills and fever.  Eyes: Negative for blurred vision and double vision.  Respiratory: Negative for cough, shortness of breath and wheezing.   Cardiovascular: Negative for chest pain and palpitations.  Gastrointestinal: Negative for abdominal pain, diarrhea, nausea and vomiting.  Genitourinary: Negative for dysuria, frequency and urgency.  Skin: Negative for itching and rash.  Neurological: Negative for dizziness, tingling and headaches.  Psychiatric/Behavioral: Negative for depression. The patient is not nervous/anxious.     See HPI for ROS as well.     Objective:   Vitals:   08/20/19 1555  BP: 121/83  Pulse: 92  Resp: 17  Temp: 99 F (37.2 C)  TempSrc: Oral  SpO2: 99%  Weight: 142 lb 3.2 oz (64.5 kg)  Height: 5' 3"  (1.6 m)    Body mass index is 25.19 kg/m.  Physical Exam Constitutional:      Appearance: Normal  appearance. She is normal weight.  HENT:     Head: Normocephalic and atraumatic.     Right Ear: Tympanic membrane normal.     Left Ear: Tympanic membrane normal.     Nose: Nose normal. No rhinorrhea.     Mouth/Throat:     Pharynx: No oropharyngeal exudate.  Eyes:     Extraocular Movements: Extraocular movements intact.  Conjunctiva/sclera: Conjunctivae normal.  Neck:     Musculoskeletal: Normal range of motion and neck supple.     Comments: No thyromegaly Cardiovascular:     Rate and Rhythm: Normal rate and regular rhythm.     Pulses: Normal pulses.     Heart sounds: Normal heart sounds. No murmur.  Pulmonary:     Effort: Pulmonary effort is normal. No respiratory distress.     Breath sounds: Normal breath sounds. No stridor. No wheezing, rhonchi or rales.  Chest:     Chest wall: No tenderness.  Abdominal:     General: Abdomen is flat. Bowel sounds are normal. There is no distension.     Palpations: Abdomen is soft. There is no mass.     Tenderness: There is no abdominal tenderness. There is no right CVA tenderness, left CVA tenderness, guarding or rebound.     Hernia: No hernia is present.  Musculoskeletal: Normal range of motion.        General: No swelling, tenderness, deformity or signs of injury.  Skin:    General: Skin is warm.     Capillary Refill: Capillary refill takes less than 2 seconds.     Coloration: Skin is not jaundiced or pale.     Findings: No bruising, erythema, lesion or rash.  Neurological:     General: No focal deficit present.     Mental Status: She is alert and oriented to person, place, and time.     Cranial Nerves: No cranial nerve deficit.  Psychiatric:        Mood and Affect: Mood normal.        Behavior: Behavior normal.        Thought Content: Thought content normal.        Judgment: Judgment normal.    With chaperone present No inguinal LAD Normal vaginal mucosa, no CMT, no uterine masses, no appreciable ovarian masses PAP SMEAR  PERFORMED    Assessment/Plan:   Patient was seen for a health maintenance exam.  Counseled the patient on health maintenance issues. Reviewed her health mainteance schedule and ordered appropriate tests (see orders.) Counseled on regular exercise and weight management. Recommend regular eye exams and dental cleaning.   The following issues were addressed today for health maintenance:   Claudia Jackson was seen today for establish care and annual exam.  Diagnoses and all orders for this visit:  Encounter for health maintenance examination in - Women's Health Maintenance Plan Advised monthly breast exam and annual mammogram Advised dental exam every six months Discussed stress management Discussed pap smear screening guidelines  Screening for metabolic disorder -     [id number]  Screening for cervical cancer - Discussed cervical cancer screening  As well as screening for HPV Discussed risk factors for cervical cancer  And hpv vaccination records reviewed  -     Cytology - PAP([place])  Screening for cholesterol level -     Lipid panel  Need for prophylactic vaccination and inoculation against influenza -     Flu Vaccine QUAD 36+ mos IM  Screening for diabetes mellitus -     POCT urinalysis dipstick  Iron deficiency anemia due to chronic blood loss -     CBC  Low libido- will screen for contributing factors -     CBC -     TSH  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    Body mass index is 25.19 kg/m.:  Discussed the patient's BMI with  patient. The BMI body mass index is 25.19 kg/m.     No future appointments.  Patient Instructions       If you have lab work done today you will be contacted with your lab results within the next 2 weeks.  If you have not heard from [place] then please contact us. The fastest way to get your results is to register for My Chart.   IF you received an x-ray today, you will receive an invoice from [facility]. Please contact [facility] at [phone] with questions or concerns regarding your invoice.   IF you received labwork today, you will receive an invoice from [facility]. Please contact [facility] at [phone] with questions or concerns regarding your invoice.   Our billing staff will not be able to assist you with questions regarding bills from these companies.  You will be contacted with the lab results as soon as they are available. The fastest way to get your results is to activate your My Chart account. Instructions are located on the last page of this paperwork. If you have not heard from [place] regarding the results in 2 weeks, please contact this office.

## 2019-08-21 LAB — CMP14+EGFR
ALT: 25 IU/L (ref 0–32)
AST: 27 IU/L (ref 0–40)
Albumin/Globulin Ratio: 1.7 (ref 1.2–2.2)
Albumin: 4.5 g/dL (ref 3.8–4.8)
Alkaline Phosphatase: 97 IU/L (ref 39–117)
BUN/Creatinine Ratio: 9 (ref 9–23)
BUN: 8 mg/dL (ref 6–20)
Bilirubin Total: 0.4 mg/dL (ref 0.0–1.2)
CO2: 19 mmol/L — ABNORMAL LOW (ref 20–29)
Calcium: 9.4 mg/dL (ref 8.7–10.2)
Chloride: 107 mmol/L — ABNORMAL HIGH (ref 96–106)
Creatinine, Ser: 0.9 mg/dL (ref 0.57–1.00)
GFR calc Af Amer: 96 mL/min/{1.73_m2} (ref >59)
GFR calc non Af Amer: 83 mL/min/{1.73_m2} (ref >59)
Globulin, Total: 2.7 g/dL (ref 1.5–4.5)
Glucose: 71 mg/dL (ref 65–99)
Potassium: 4.2 mmol/L (ref 3.5–5.2)
Sodium: 140 mmol/L (ref 134–144)
Total Protein: 7.2 g/dL (ref 6.0–8.5)

## 2019-08-21 LAB — CBC
Hematocrit: 39.3 % (ref 34.0–46.6)
Hemoglobin: 12.8 g/dL (ref 11.1–15.9)
MCH: 28.3 pg (ref 26.6–33.0)
MCHC: 32.6 g/dL (ref 31.5–35.7)
MCV: 87 fL (ref 79–97)
Platelets: 475 10*3/uL — ABNORMAL HIGH (ref 150–450)
RBC: 4.52 x10E6/uL (ref 3.77–5.28)
RDW: 12.9 % (ref 11.7–15.4)
WBC: 8.6 10*3/uL (ref 3.4–10.8)

## 2019-08-21 LAB — LIPID PANEL
Chol/HDL Ratio: 4.6 ratio — ABNORMAL HIGH (ref 0.0–4.4)
Cholesterol, Total: 170 mg/dL (ref 100–199)
HDL: 37 mg/dL — ABNORMAL LOW (ref >39)
LDL Chol Calc (NIH): 117 mg/dL — ABNORMAL HIGH (ref 0–99)
Triglycerides: 87 mg/dL (ref 0–149)
VLDL Cholesterol Cal: 16 mg/dL (ref 5–40)

## 2019-08-21 LAB — TSH: TSH: 1.22 u[IU]/mL (ref 0.450–4.500)

## 2019-08-23 ENCOUNTER — Encounter: Payer: Self-pay | Admitting: Family Medicine

## 2019-08-27 ENCOUNTER — Encounter: Payer: Self-pay | Admitting: Family Medicine

## 2019-08-28 LAB — CYTOLOGY - PAP
Chlamydia: NEGATIVE
Diagnosis: NEGATIVE
High risk HPV: NEGATIVE
Neisseria Gonorrhea: NEGATIVE

## 2019-08-30 ENCOUNTER — Other Ambulatory Visit: Payer: Self-pay | Admitting: Family Medicine

## 2019-08-30 DIAGNOSIS — R7989 Other specified abnormal findings of blood chemistry: Secondary | ICD-10-CM

## 2019-10-18 ENCOUNTER — Ambulatory Visit (INDEPENDENT_AMBULATORY_CARE_PROVIDER_SITE_OTHER): Payer: 59 | Admitting: Family Medicine

## 2019-10-18 ENCOUNTER — Other Ambulatory Visit: Payer: Self-pay

## 2019-10-18 DIAGNOSIS — Z3049 Encounter for surveillance of other contraceptives: Secondary | ICD-10-CM

## 2019-10-18 MED ORDER — MEDROXYPROGESTERONE ACETATE 150 MG/ML IM SUSP
150.0000 mg | Freq: Once | INTRAMUSCULAR | Status: AC
  Administered 2019-10-18: 150 mg via INTRAMUSCULAR

## 2020-01-10 ENCOUNTER — Ambulatory Visit (INDEPENDENT_AMBULATORY_CARE_PROVIDER_SITE_OTHER): Payer: 59 | Admitting: Family Medicine

## 2020-01-10 ENCOUNTER — Other Ambulatory Visit: Payer: Self-pay

## 2020-01-10 DIAGNOSIS — Z3049 Encounter for surveillance of other contraceptives: Secondary | ICD-10-CM

## 2020-01-10 MED ORDER — MEDROXYPROGESTERONE ACETATE 150 MG/ML IM SUSP
150.0000 mg | INTRAMUSCULAR | Status: AC
  Administered 2020-01-10 – 2020-06-30 (×2): 150 mg via INTRAMUSCULAR

## 2020-04-09 ENCOUNTER — Ambulatory Visit (INDEPENDENT_AMBULATORY_CARE_PROVIDER_SITE_OTHER): Payer: 59 | Admitting: Emergency Medicine

## 2020-04-09 ENCOUNTER — Other Ambulatory Visit: Payer: Self-pay

## 2020-04-09 ENCOUNTER — Ambulatory Visit
Admission: EM | Admit: 2020-04-09 | Discharge: 2020-04-09 | Disposition: A | Discharge status: Home / Self Care | Payer: 59

## 2020-04-09 DIAGNOSIS — N949 Unspecified condition associated with female genital organs and menstrual cycle: Secondary | ICD-10-CM | POA: Diagnosis not present

## 2020-04-09 DIAGNOSIS — Z3049 Encounter for surveillance of other contraceptives: Secondary | ICD-10-CM

## 2020-04-09 DIAGNOSIS — Z3042 Encounter for surveillance of injectable contraceptive: Secondary | ICD-10-CM

## 2020-04-09 MED ORDER — MEDROXYPROGESTERONE ACETATE 150 MG/ML IM SUSP
150.0000 mg | Freq: Once | INTRAMUSCULAR | Status: AC
  Administered 2020-04-09: 150 mg via INTRAMUSCULAR

## 2020-04-09 NOTE — ED Triage Notes (Signed)
Pt states has a spot on her vagina that want the PA to look at.

## 2020-04-09 NOTE — Discharge Instructions (Addendum)
Monitor and return for additional lesions, pain, itching, change in size or color.

## 2020-04-09 NOTE — ED Provider Notes (Signed)
[facility]    CSN: [id number] Arrival date & time: 04/09/20  1730      History   Chief Complaint Chief Complaint  Patient presents with  . Rash    HPI Claudia Jackson is a 34 y.o. female with history of allergies, anemia presenting for evaluation of left-sided vaginal/labial skin lesion.  States she noticed this about 3 days ago.  Denies pain, tenderness, vaginal discharge, pelvic pain, vaginal pain, irregular bleeding.  States she received her Depo earlier today which she has been tolerating well.  Patient denies history of STI: Last coitus 4 years ago and has undergone STI screening since then.  Denies pruritus, bleeding.  Has not tried thing for this.   Past Medical History:  Diagnosis Date  . Allergy   . Anemia     Patient Active Problem List   Diagnosis Date Noted  . 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 Menorrhagia with regular cycle 05/02/2014  . Cervical dysplasia, mild 05/02/2014  . Iron deficiency anemia due to chronic blood loss 05/02/2014    History reviewed. No pertinent surgical history.  OB History    Gravida  1   Para  1   Term      Preterm      AB      Living  1     SAB      TAB      Ectopic      Multiple      Live Births               Home Medications    Prior to Admission medications   Medication Sig Start Date End Date Taking? Authorizing Provider  fluticasone (FLONASE) 50 MCG/ACT nasal spray Place 2 sprays into both nostrils daily. 01/14/17   Doristine Bosworth, MD  loratadine (CLARITIN) 10 MG tablet Take 10 mg by mouth daily.    [provider]  Multiple Vitamin (MULTIVITAMIN) LIQD Take 5 mLs by mouth daily.    [provider]    Family History Family History  Problem Relation Age of Onset  . Cancer Brother 13       Hodgkin's Lymphoma in remission  . Hyperlipidemia Brother   . Asthma Son   . Cancer Paternal Aunt        BRAIN  . Breast cancer Maternal  Grandmother   . Cancer Maternal Grandmother        BRAIN    Social History Social History   Tobacco Use  . Smoking status: Never Smoker  . Smokeless tobacco: Never Used  Substance Use Topics  . Alcohol use: Yes    Comment: socially  . Drug use: No     Allergies   Patient has no known allergies.   Review of Systems As per HPI   Physical Exam Triage Vital Signs ED Triage Vitals  Enc Vitals Group     BP      Pulse      Resp      Temp      Temp src      SpO2      Weight      Height      Head Circumference      Peak Flow      Pain Score      Pain Loc      Pain Edu?      Excl. in GC?    No data found.  Updated  Vital Signs BP 106/70 (BP Location: Left Arm)   Pulse (!) 103   Temp 98.6 F (37 C) (Oral)   Resp 16   SpO2 98%   Visual Acuity Right Eye Distance:   Left Eye Distance:   Bilateral Distance:    Right Eye Near:   Left Eye Near:    Bilateral Near:     Physical Exam Constitutional:      General: She is not in acute distress. HENT:     Head: Normocephalic and atraumatic.  Eyes:     General: No scleral icterus.    Pupils: Pupils are equal, round, and reactive to light.  Cardiovascular:     Rate and Rhythm: Normal rate.  Pulmonary:     Effort: Pulmonary effort is normal.  Genitourinary:    Vagina: No vaginal discharge.     Comments: Small (4 mm) left vulvar lesion at end of stray.  Nontender, not rubbery.  No open wound, discharge, warmth or erythema. Skin:    Coloration: Skin is not jaundiced or pale.  Neurological:     Mental Status: She is alert and oriented to person, place, and time.      UC Treatments / Results  Labs (all labs ordered are listed, but only abnormal results are displayed) Labs Reviewed - No data to display  EKG   Radiology No results found.  Procedures Procedures (including critical care time)  Medications Ordered in UC Medications - No data to display  Initial Impression / Assessment and Plan / UC  Course  I have reviewed the triage vital signs and the nursing notes.  Pertinent labs & imaging results that were available during my care of the patient were reviewed by me and considered in my medical decision making (see chart for details).     Patient appears well.  Per chart review, last Pap smear performed in October 2020: NIL, negative HPV.  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  Will monitor closely.  Return precautions discussed, patient verbalized understanding and is agreeable to plan. Final Clinical Impressions(s) / UC Diagnoses   Final diagnoses:  Genital lesion, female     Discharge Instructions     Monitor and return for additional lesions, pain, itching, change in size or color.    ED Prescriptions    None     PDMP not reviewed this encounter.   Hall[place]Potvin, [place], [place] 04/09/20 1828

## 2020-05-21 ENCOUNTER — Telehealth: Payer: 59 | Admitting: Internal Medicine

## 2020-06-27 ENCOUNTER — Ambulatory Visit: Payer: 59

## 2020-06-30 ENCOUNTER — Other Ambulatory Visit: Payer: Self-pay

## 2020-06-30 ENCOUNTER — Ambulatory Visit (INDEPENDENT_AMBULATORY_CARE_PROVIDER_SITE_OTHER): Payer: 59 | Admitting: Family Medicine

## 2020-06-30 DIAGNOSIS — Z3042 Encounter for surveillance of injectable contraceptive: Secondary | ICD-10-CM

## 2020-06-30 DIAGNOSIS — Z3049 Encounter for surveillance of other contraceptives: Secondary | ICD-10-CM

## 2020-06-30 NOTE — Progress Notes (Signed)
Pt here today for Depo injection, given on her Lt ventrogluteal

## 2020-06-30 NOTE — Patient Instructions (Signed)
° ° ° °  If you have lab work done today you will be contacted with your lab results within the next 2 weeks.  If you have not heard from us then please contact us. The fastest way to get your results is to register for My Chart. ° ° °IF you received an x-ray today, you will receive an invoice from [facility]. Please contact [facility] at [phone] with questions or concerns regarding your invoice.  ° °IF you received labwork today, you will receive an invoice from [facility]. Please contact [facility] at [phone] with questions or concerns regarding your invoice.  ° °Our billing staff will not be able to assist you with questions regarding bills from these companies. ° °You will be contacted with the lab results as soon as they are available. The fastest way to get your results is to activate your My Chart account. Instructions are located on the last page of this paperwork. If you have not heard from us regarding the results in 2 weeks, please contact this office. °  ° ° ° °

## 2021-12-11 ENCOUNTER — Other Ambulatory Visit: Payer: Self-pay

## 2021-12-11 ENCOUNTER — Ambulatory Visit
Admission: EM | Admit: 2021-12-11 | Discharge: 2021-12-11 | Disposition: A | Discharge status: Home / Self Care | Payer: 59 | Attending: Internal Medicine | Admitting: Internal Medicine

## 2021-12-11 DIAGNOSIS — J069 Acute upper respiratory infection, unspecified: Secondary | ICD-10-CM | POA: Diagnosis not present

## 2021-12-11 MED ORDER — BENZONATATE 100 MG PO CAPS: 100.0000 mg | ORAL_CAPSULE | Freq: Three times a day (TID) | ORAL | 0 refills | Status: AC | PRN

## 2021-12-11 MED ORDER — FLUTICASONE PROPIONATE 50 MCG/ACT NA SUSP: 1.0000 | Freq: Every day | NASAL | 0 refills | Status: AC

## 2021-12-11 MED ORDER — CETIRIZINE HCL 10 MG PO TABS: 10.0000 mg | ORAL_TABLET | Freq: Every day | ORAL | 0 refills | Status: AC

## 2021-12-11 NOTE — Discharge Instructions (Addendum)
It appears that you have a viral upper respiratory infection that should resolve on its own in the next few days.  You have been prescribed 3 medications to help alleviate symptoms.  Please follow-up if symptoms persist or worsen.  Do not take Claritin and cetirizine together.

## 2021-12-11 NOTE — ED Provider Notes (Signed)
[facility]    CSN: [id number] Arrival date & time: 12/11/21  1230      History   Chief Complaint Chief Complaint  Patient presents with   Cough    HPI Claudia Jackson is a 36 y.o. female.   Patient presents with 1 week history of nasal congestion and cough.  Denies any known fevers or sick contacts.  Patient has not taken any medications to alleviate symptoms.  Denies chest pain, shortness of breath, sore throat, ear pain, nausea, vomiting, diarrhea, abdominal pain.   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 No pertinent surgical history.  OB History     Gravida  1   Para  1   Term      Preterm      AB      Living  1      SAB      IAB      Ectopic      Multiple      Live Births               Home Medications    Prior to Admission medications   Medication Sig Start Date End Date Taking? Authorizing Provider  benzonatate (TESSALON) 100 MG capsule Take 1 capsule (100 mg total) by mouth every 8 (eight) hours as needed for cough. 12/11/21  Yes Luvena Wentling, Acie Fredrickson, FNP  cetirizine (ZYRTEC) 10 MG tablet Take 1 tablet (10 mg total) by mouth daily for 10 days. 12/11/21 12/21/21 Yes Kymani Shimabukuro, Acie Fredrickson, FNP  fluticasone (FLONASE) 50 MCG/ACT nasal spray Place 1 spray into both nostrils daily for 3 days. 12/11/21 12/14/21 Yes Charina Fons, Acie Fredrickson, FNP  loratadine (CLARITIN) 10 MG tablet Take 10 mg by mouth daily.    [provider]    Family History Family History  Problem Relation Age of Onset   Cancer Brother 65       Hodgkin's Lymphoma in remission   Hyperlipidemia Brother    Asthma Son    Cancer Paternal Aunt        BRAIN    Breast cancer Maternal Grandmother    Cancer Maternal Grandmother        BRAIN    Social History Social History   Tobacco Use   Smoking status: Never   Smokeless tobacco: Never  Vaping Use   Vaping Use: Never used  Substance Use Topics   Alcohol use: Yes    Comment: socially   Drug use: No     Allergies   Patient has no known allergies.   Review of Systems Review of Systems Per HPI  Physical Exam Triage Vital Signs ED Triage Vitals  Enc Vitals Group     BP 12/11/21 1333 95/61     Pulse Rate 12/11/21 1333 (!) 101     Resp 12/11/21 1333 18     Temp 12/11/21 1333 98.9 F (37.2 C)     Temp Source 12/11/21 1333 Oral     SpO2 12/11/21 1333 98 %     Weight --      Height --      Head Circumference --  Peak Flow --      Pain Score 12/11/21 1334 0     Pain Loc --      Pain Edu? --      Excl. in GC? --    No data found.  Updated Vital Signs BP 95/61 (BP Location: Left Arm)    Pulse (!) 101    Temp 98.9 F (37.2 C) (Oral)    Resp 18    SpO2 98%   Visual Acuity Right Eye Distance:   Left Eye Distance:   Bilateral Distance:    Right Eye Near:   Left Eye Near:    Bilateral Near:     Physical Exam Constitutional:      General: She is not in acute distress.    Appearance: Normal appearance. She is not toxic-appearing or diaphoretic.  HENT:     Head: Normocephalic and atraumatic.     Right Ear: Tympanic membrane and ear canal normal.     Left Ear: Tympanic membrane and ear canal normal.     Nose: Congestion present.     Mouth/Throat:     Mouth: Mucous membranes are moist.     Pharynx: No posterior oropharyngeal erythema.  Eyes:     Extraocular Movements: Extraocular movements intact.     Conjunctiva/sclera: Conjunctivae normal.     Pupils: Pupils are equal, round, and reactive to light.  Cardiovascular:     Rate and Rhythm: Normal rate and regular rhythm.     Pulses: Normal pulses.     Heart sounds: Normal heart sounds.  Pulmonary:     Effort:  Pulmonary effort is normal. No respiratory distress.     Breath sounds: Normal breath sounds. No stridor. No wheezing, rhonchi or rales.  Abdominal:     General: Abdomen is flat. Bowel sounds are normal.     Palpations: Abdomen is soft.  Musculoskeletal:        General: Normal range of motion.     Cervical back: Normal range of motion.  Skin:    General: Skin is warm and dry.  Neurological:     General: No focal deficit present.     Mental Status: She is alert and oriented to person, place, and time. Mental status is at baseline.  Psychiatric:        Mood and Affect: Mood normal.        Behavior: Behavior normal.     UC Treatments / Results  Labs (all labs ordered are listed, but only abnormal results are displayed) Labs Reviewed  NOVEL CORONAVIRUS, NAA    EKG   Radiology No results found.  Procedures Procedures (including critical care time)  Medications Ordered in UC Medications - No data to display  Initial Impression / Assessment and Plan / UC Course  I have reviewed the triage vital signs and the nursing notes.  Pertinent labs & imaging results that were available during my care of the patient were reviewed by me and considered in my medical decision making (see chart for details).     Patient presents with symptoms likely from a viral upper respiratory infection. Differential includes bacterial pneumonia, sinusitis, allergic rhinitis, COVID-19, flu. Do not suspect underlying cardiopulmonary process. Symptoms seem unlikely related to ACS, CHF or COPD exacerbations, pneumonia, pneumothorax. Patient is nontoxic appearing and not in need of emergent medical intervention.  COVID-19 PCR pending.  Recommended symptom control with over the counter medications: Daily oral anti-histamine, Oral decongestant or IN corticosteroid, saline irrigations, cepacol lozenges, Robitussin, Delsym, honey tea.  Patient sent prescriptions.  Return if symptoms fail to improve in 1-2 weeks  or you develop shortness of breath, chest pain, severe headache. Patient states understanding and is agreeable.  Discharged with PCP followup.  Final Clinical Impressions(s) / UC Diagnoses   Final diagnoses:  Viral upper respiratory tract infection with cough     Discharge Instructions      It appears that you have a viral upper respiratory infection that should resolve on its own in the next few days.  You have been prescribed 3 medications to help alleviate symptoms.  Please follow-up if symptoms persist or worsen.  Do not take Claritin and cetirizine together.    ED Prescriptions     Medication Sig Dispense Auth. Provider   benzonatate (TESSALON) 100 MG capsule Take 1 capsule (100 mg total) by mouth every 8 (eight) hours as needed for cough. 21 capsule [place], [place], [place]   cetirizine (ZYRTEC) 10 MG tablet Take 1 tablet (10 mg total) by mouth daily for 10 days. 30 tablet [place], [place], [place]   fluticasone [facility]) 50 MCG/ACT nasal spray Place 1 spray into both nostrils daily for 3 days. 16 g Gustavus Bryant, [place]      PDMP not reviewed this encounter.   Gustavus Bryant, [place] 12/11/21 [phone]

## 2021-12-11 NOTE — ED Triage Notes (Signed)
Pt c/o cough, nasal congestion,   Denies sore throat, earache, body aches or chills, nausea, vomiting, diarrhea, constipation   Onset ~ 1 week

## 2021-12-12 LAB — NOVEL CORONAVIRUS, NAA: SARS-CoV-2, NAA: NOT DETECTED

## 2021-12-12 LAB — SARS-COV-2, NAA 2 DAY TAT

## 2022-11-22 ENCOUNTER — Ambulatory Visit: Admission: EM | Admit: 2022-11-22 | Discharge: 2022-11-22 | Discharge status: Against Medical Advice | Payer: 59

## 2024-05-19 ENCOUNTER — Other Ambulatory Visit: Payer: Self-pay

## 2024-05-19 ENCOUNTER — Ambulatory Visit (HOSPITAL_COMMUNITY)
Admission: EM | Admit: 2024-05-19 | Discharge: 2024-05-19 | Disposition: A | Discharge status: Home / Self Care | Attending: Internal Medicine | Admitting: Internal Medicine

## 2024-05-19 ENCOUNTER — Encounter (HOSPITAL_COMMUNITY): Payer: Self-pay

## 2024-05-19 DIAGNOSIS — K029 Dental caries, unspecified: Secondary | ICD-10-CM | POA: Diagnosis not present

## 2024-05-19 DIAGNOSIS — K0889 Other specified disorders of teeth and supporting structures: Secondary | ICD-10-CM | POA: Diagnosis not present

## 2024-05-19 MED ORDER — LIDOCAINE VISCOUS HCL 2 % MT SOLN: 15.0000 mL | OROMUCOSAL | 0 refills | Status: AC | PRN

## 2024-05-19 MED ORDER — AMOXICILLIN-POT CLAVULANATE 875-125 MG PO TABS: 1.0000 | ORAL_TABLET | Freq: Two times a day (BID) | ORAL | 0 refills | Status: AC

## 2024-05-19 MED ORDER — IBUPROFEN 800 MG PO TABS: 800.0000 mg | ORAL_TABLET | Freq: Three times a day (TID) | ORAL | 0 refills | Status: AC

## 2024-05-19 NOTE — ED Provider Notes (Signed)
 [facility]    CSN: [id number] Arrival date & time: 05/19/24  1028      History   Chief Complaint Chief Complaint  Patient presents with   Dental Pain    HPI Claudia Jackson is a 38 y.o. female.   Patient presents to urgent care for evaluation of dental pain to the right lower mouth most posterior tooth that started proximately 2 weeks ago.  Denies recent trauma/injuries to the mouth to cause dental pain.  She started having cold sensitivity and pain so she took ibuprofen  and Tylenol.  Pain initially responded well to Tylenol and ibuprofen  and has become increasingly more severe over the last few days.  She called her dentist ([facility]) who recommended urgent care visit for potential dental infection and pain management.  She denies fever, chills, nausea, vomiting, body aches, headaches, dizziness, sore throat, and dental swelling.  Denies changes in voice sounds/difficulty maintaining secretions.  She has had poor oral intake due to dental pain over the last few days.  No recent antibiotic/steroid use.     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 No pertinent surgical history.  OB History     Gravida  1   Para  1   Term      Preterm      AB      Living  1      SAB      IAB      Ectopic      Multiple      Live Births               Home Medications    Prior to Admission medications   Medication Sig Start Date End Date Taking? Authorizing Provider  amoxicillin -clavulanate (AUGMENTIN ) 875-125 MG tablet Take 1 tablet by mouth every 12 (twelve) hours. 05/19/24  Yes Enedelia Dorna HERO, FNP  ibuprofen  (ADVIL ) 800  MG tablet Take 1 tablet (800 mg total) by mouth 3 (three) times daily. 05/19/24  Yes Enedelia Dorna HERO, FNP  lidocaine  (XYLOCAINE ) 2 % solution Use as directed 15 mLs in the mouth or throat as needed for mouth pain. 05/19/24  Yes Enedelia Dorna HERO, FNP  benzonatate  (TESSALON ) 100 MG capsule Take 1 capsule (100 mg total) by mouth every 8 (eight) hours as needed for cough. 12/11/21   Hazen Darryle BRAVO, FNP  cetirizine  (ZYRTEC ) 10 MG tablet Take 1 tablet (10 mg total) by mouth daily for 10 days. 12/11/21 12/21/21  Hazen Darryle BRAVO, FNP  fluticasone  (FLONASE ) 50 MCG/ACT nasal spray Place 1 spray into both nostrils daily for 3 days. 12/11/21 12/14/21  Hazen Darryle BRAVO, FNP  loratadine (CLARITIN) 10 MG tablet Take 10 mg by mouth daily.    [provider]    Family History Family History  Problem Relation Age of Onset   Cancer Brother 68       Hodgkin's Lymphoma in remission   Hyperlipidemia Brother    Asthma Son    Cancer Paternal Aunt        BRAIN   Breast cancer Maternal Grandmother    Cancer Maternal Grandmother  BRAIN    Social History Social History   Tobacco Use   Smoking status: Never   Smokeless tobacco: Never  Vaping Use   Vaping status: Never Used  Substance Use Topics   Alcohol use: Yes    Comment: socially   Drug use: No     Allergies   Patient has no known allergies.   Review of Systems Review of Systems Per HPI  Physical Exam Triage Vital Signs ED Triage Vitals  Encounter Vitals Group     BP 05/19/24 1057 120/81     Girls Systolic BP Percentile --      Girls Diastolic BP Percentile --      Boys Systolic BP Percentile --      Boys Diastolic BP Percentile --      Pulse Rate 05/19/24 1057 80     Resp 05/19/24 1057 18     Temp 05/19/24 1057 98.1 F (36.7 C)     Temp Source 05/19/24 1057 Oral     SpO2 05/19/24 1057 96 %     Weight --      Height --      Head Circumference --      Peak Flow --      Pain Score 05/19/24 1053 10     Pain Loc --       Pain Education --      Exclude from Growth Chart --    No data found.  Updated Vital Signs BP 120/81 (BP Location: Left Arm)   Pulse 80   Temp 98.1 F (36.7 C) (Oral)   Resp 18   LMP 04/29/2024 (Exact Date)   SpO2 96%   Visual Acuity Right Eye Distance:   Left Eye Distance:   Bilateral Distance:    Right Eye Near:   Left Eye Near:    Bilateral Near:     Physical Exam Vitals and nursing note reviewed.  Constitutional:      Appearance: She is not ill-appearing or toxic-appearing.  HENT:     Head: Normocephalic and atraumatic.     Right Ear: Hearing and external ear normal.     Left Ear: Hearing and external ear normal.     Nose: Nose normal.     Mouth/Throat:     Lips: Pink.     Mouth: Mucous membranes are moist. No injury or oral lesions.     Dentition: Abnormal dentition (Dental carry with previous filling present to tooth #32). Does not have dentures. Dental tenderness (Tooth #32) and dental caries present. No gingival swelling, dental abscesses or gum lesions.     Tongue: No lesions.     Pharynx: Oropharynx is clear. Uvula midline. No pharyngeal swelling, oropharyngeal exudate, posterior oropharyngeal erythema, uvula swelling or postnasal drip.     Tonsils: No tonsillar exudate.     Comments: No palpable or visualized dental abscess. No trismus, phonation normal, maintaining secretions without difficulty.  Eyes:     General: Lids are normal. Vision grossly intact. Gaze aligned appropriately.     Extraocular Movements: Extraocular movements intact.     Conjunctiva/sclera: Conjunctivae normal.  Neck:     Trachea: Trachea and phonation normal.  Pulmonary:     Effort: Pulmonary effort is normal.  Musculoskeletal:     Cervical back: Neck supple.  Lymphadenopathy:     Cervical: Cervical adenopathy present.  Skin:    General: Skin is warm and dry.     Capillary Refill: Capillary refill takes less than 2 seconds.  Findings: No rash.  Neurological:      General: No focal deficit present.     Mental Status: She is alert and oriented to person, place, and time. Mental status is at baseline.     Cranial Nerves: No dysarthria or facial asymmetry.  Psychiatric:        Mood and Affect: Mood normal.        Speech: Speech normal.        Behavior: Behavior normal.        Thought Content: Thought content normal.        Judgment: Judgment normal.      UC Treatments / Results  Labs (all labs ordered are listed, but only abnormal results are displayed) Labs Reviewed - No data to display  EKG   Radiology No results found.  Procedures Procedures (including critical care time)  Medications Ordered in UC Medications - No data to display  Initial Impression / Assessment and Plan / UC Course  I have reviewed the triage vital signs and the nursing notes.  Pertinent labs & imaging results that were available during my care of the patient were reviewed by me and considered in my medical decision making (see chart for details).   1.  Dental caries, dental pain Evaluation suggests dental pain secondary to dental infection.   HEENT exam stable and without red flag signs indicating need for advanced imaging/further emergent workup. Patient is afebrile, nontoxic in appearance, and with hemodynamically stable vital signs.   Antibiotic ordered.  Recommend supportive care for symptomatic relief as outlined in AVS.  Encouraged to follow-up with dentist for further management.  She has an appointment with her dentist scheduled for 2 days from now on Monday, May 21, 2024.  Counseled patient on potential for adverse effects with medications prescribed/recommended today, strict ER and return-to-clinic precautions discussed, patient verbalized understanding.    Final Clinical Impressions(s) / UC Diagnoses   Final diagnoses:  Dental caries  Pain, dental     Discharge Instructions      Your dental pain is likely due to dental infection. Take   antibiotic as prescribed for the next 7 days to treat your dental infection. Continue use of ibuprofen  as needed with food for dental inflammation and pain.   You may also use tylenol as needed for pain. Perform salt water gargles every 3-4 hours.  Schedule an appointment with one of the dentists on the list provided to urgent care today.  If you develop any new or worsening symptoms or if your symptoms do not start to improve, pleases return here or follow-up with your primary care provider. If your symptoms are severe, please go to the emergency room.    ED Prescriptions     Medication Sig Dispense Auth. Provider   ibuprofen  (ADVIL ) 800 MG tablet Take 1 tablet (800 mg total) by mouth 3 (three) times daily. 21 tablet Zamir Staples M, FNP   amoxicillin -clavulanate (AUGMENTIN ) 875-125 MG tablet Take 1 tablet by mouth every 12 (twelve) hours. 14 tablet Enedelia Dorna HERO, FNP   lidocaine  (XYLOCAINE ) 2 % solution Use as directed 15 mLs in the mouth or throat as needed for mouth pain. 100 mL Enedelia Dorna HERO, FNP      PDMP not reviewed this encounter.   Enedelia Dorna HERO, [place] 05/19/24 1156

## 2024-05-19 NOTE — Discharge Instructions (Signed)
 Your dental pain is likely due to dental infection. Take  antibiotic as prescribed for the next 7 days to treat your dental infection. Continue use of ibuprofen as needed with food for dental inflammation and pain.   You may also use tylenol as needed for pain. Perform salt water gargles every 3-4 hours.  Schedule an appointment with one of the dentists on the list provided to urgent care today.  If you develop any new or worsening symptoms or if your symptoms do not start to improve, pleases return here or follow-up with your primary care provider. If your symptoms are severe, please go to the emergency room.

## 2024-05-19 NOTE — ED Triage Notes (Signed)
 Pt states that she has had RT tooth pain for 2 weeks, as well as sharp pains in her ear, now but it got worse within this past week. Pt states it is almost unbearable. Pt states she talked to her dentist this morning and they directed her to urgent care for possible pain management until she can be seen in office. Pt has been taking Ibuprofen  and Tylenol and they were working at first but now they aren't helping which is why she thinks her Dr pointed her here.
